# Patient Record
Sex: Male | Born: 1960 | Race: White | Hispanic: No | Marital: Married | Smoking: Current every day smoker
Health system: Southern US, Community
[De-identification: ages and names within clinical notes are randomized; demographics above are authoritative.]

## PROBLEM LIST (undated history)

## (undated) DIAGNOSIS — D805 Immunodeficiency with increased immunoglobulin M [IgM]: Secondary | ICD-10-CM

## (undated) DIAGNOSIS — C8590 Non-Hodgkin lymphoma, unspecified, unspecified site: Secondary | ICD-10-CM

## (undated) HISTORY — PX: TESTICLE TORSION REDUCTION: SHX795

## (undated) HISTORY — DX: Non-Hodgkin lymphoma, unspecified, unspecified site: C85.90

## (undated) HISTORY — PX: GASTRIC BYPASS: SHX52

## (undated) HISTORY — PX: CHOLECYSTECTOMY: SHX55

## (undated) HISTORY — PX: HERNIA REPAIR: SHX51

---

## 2008-10-22 DIAGNOSIS — C8590 Non-Hodgkin lymphoma, unspecified, unspecified site: Secondary | ICD-10-CM

## 2008-10-22 HISTORY — DX: Non-Hodgkin lymphoma, unspecified, unspecified site: C85.90

## 2010-06-23 ENCOUNTER — Ambulatory Visit (HOSPITAL_BASED_OUTPATIENT_CLINIC_OR_DEPARTMENT_OTHER): Admission: RE | Admit: 2010-06-23 | Discharge: 2010-06-23 | Payer: Self-pay | Admitting: Urology

## 2010-12-26 DIAGNOSIS — R072 Precordial pain: Secondary | ICD-10-CM

## 2010-12-27 DIAGNOSIS — R079 Chest pain, unspecified: Secondary | ICD-10-CM

## 2011-01-04 LAB — CBC
MCV: 91.3 fL (ref 78.0–100.0)
Platelets: 232 10*3/uL (ref 150–400)
RDW: 13.3 % (ref 11.5–15.5)
WBC: 16.1 10*3/uL — ABNORMAL HIGH (ref 4.0–10.5)

## 2012-03-24 ENCOUNTER — Other Ambulatory Visit (HOSPITAL_COMMUNITY): Payer: Self-pay | Admitting: Internal Medicine

## 2012-03-24 DIAGNOSIS — M545 Low back pain: Secondary | ICD-10-CM

## 2012-03-27 ENCOUNTER — Other Ambulatory Visit (HOSPITAL_COMMUNITY): Payer: BC Managed Care – PPO

## 2012-09-02 DIAGNOSIS — J329 Chronic sinusitis, unspecified: Secondary | ICD-10-CM

## 2012-09-02 DIAGNOSIS — D72820 Lymphocytosis (symptomatic): Secondary | ICD-10-CM

## 2012-09-02 DIAGNOSIS — M549 Dorsalgia, unspecified: Secondary | ICD-10-CM

## 2016-10-19 ENCOUNTER — Other Ambulatory Visit (HOSPITAL_COMMUNITY): Payer: Self-pay | Admitting: Chiropractic Medicine

## 2016-10-19 DIAGNOSIS — M9903 Segmental and somatic dysfunction of lumbar region: Secondary | ICD-10-CM

## 2016-10-24 ENCOUNTER — Ambulatory Visit (HOSPITAL_COMMUNITY): Payer: BLUE CROSS/BLUE SHIELD

## 2016-10-24 ENCOUNTER — Ambulatory Visit (HOSPITAL_COMMUNITY)
Admission: RE | Admit: 2016-10-24 | Discharge: 2016-10-24 | Disposition: A | Discharge status: Home / Self Care | Payer: BLUE CROSS/BLUE SHIELD | Source: Ambulatory Visit | Attending: Chiropractic Medicine | Admitting: Chiropractic Medicine

## 2016-10-24 ENCOUNTER — Other Ambulatory Visit (HOSPITAL_COMMUNITY): Payer: Self-pay | Admitting: Chiropractic Medicine

## 2016-10-24 ENCOUNTER — Other Ambulatory Visit (HOSPITAL_COMMUNITY): Payer: Self-pay | Admitting: Nurse Practitioner

## 2016-10-24 DIAGNOSIS — M9903 Segmental and somatic dysfunction of lumbar region: Secondary | ICD-10-CM

## 2016-10-24 DIAGNOSIS — M542 Cervicalgia: Secondary | ICD-10-CM

## 2016-10-24 DIAGNOSIS — M5124 Other intervertebral disc displacement, thoracic region: Secondary | ICD-10-CM | POA: Diagnosis not present

## 2016-10-24 DIAGNOSIS — M5136 Other intervertebral disc degeneration, lumbar region: Secondary | ICD-10-CM | POA: Diagnosis not present

## 2016-10-24 DIAGNOSIS — M5416 Radiculopathy, lumbar region: Secondary | ICD-10-CM | POA: Insufficient documentation

## 2016-10-24 DIAGNOSIS — M50221 Other cervical disc displacement at C4-C5 level: Secondary | ICD-10-CM | POA: Diagnosis not present

## 2016-12-25 ENCOUNTER — Other Ambulatory Visit: Payer: Self-pay | Admitting: Neurosurgery

## 2016-12-25 DIAGNOSIS — M5020 Other cervical disc displacement, unspecified cervical region: Secondary | ICD-10-CM

## 2016-12-25 DIAGNOSIS — M5124 Other intervertebral disc displacement, thoracic region: Secondary | ICD-10-CM

## 2017-01-03 ENCOUNTER — Ambulatory Visit
Admission: RE | Admit: 2017-01-03 | Discharge: 2017-01-03 | Disposition: A | Discharge status: Home / Self Care | Payer: BLUE CROSS/BLUE SHIELD | Source: Ambulatory Visit | Attending: Neurosurgery | Admitting: Neurosurgery

## 2017-01-03 DIAGNOSIS — M5124 Other intervertebral disc displacement, thoracic region: Secondary | ICD-10-CM

## 2017-01-03 MED ORDER — IOPAMIDOL (ISOVUE-M 300) INJECTION 61%
1.0000 mL | Freq: Once | INTRAMUSCULAR | Status: AC | PRN
  Administered 2017-01-03: 1 mL via EPIDURAL

## 2017-01-03 MED ORDER — TRIAMCINOLONE ACETONIDE 40 MG/ML IJ SUSP (RADIOLOGY)
60.0000 mg | Freq: Once | INTRAMUSCULAR | Status: AC
  Administered 2017-01-03: 60 mg via EPIDURAL

## 2017-01-03 NOTE — Discharge Instructions (Signed)

## 2017-01-16 ENCOUNTER — Other Ambulatory Visit: Payer: Self-pay | Admitting: Nurse Practitioner

## 2017-01-16 DIAGNOSIS — M5124 Other intervertebral disc displacement, thoracic region: Secondary | ICD-10-CM

## 2017-01-18 ENCOUNTER — Ambulatory Visit
Admission: RE | Admit: 2017-01-18 | Discharge: 2017-01-18 | Disposition: A | Discharge status: Home / Self Care | Payer: BLUE CROSS/BLUE SHIELD | Source: Ambulatory Visit | Attending: Nurse Practitioner | Admitting: Nurse Practitioner

## 2017-01-18 DIAGNOSIS — M5124 Other intervertebral disc displacement, thoracic region: Secondary | ICD-10-CM

## 2017-01-18 MED ORDER — TRIAMCINOLONE ACETONIDE 40 MG/ML IJ SUSP (RADIOLOGY)
60.0000 mg | Freq: Once | INTRAMUSCULAR | Status: AC
  Administered 2017-01-18: 60 mg via EPIDURAL

## 2017-01-18 MED ORDER — IOPAMIDOL (ISOVUE-M 300) INJECTION 61%
1.0000 mL | Freq: Once | INTRAMUSCULAR | Status: AC | PRN
  Administered 2017-01-18: 1 mL via EPIDURAL

## 2017-10-10 ENCOUNTER — Other Ambulatory Visit: Payer: Self-pay

## 2017-10-10 ENCOUNTER — Other Ambulatory Visit (HOSPITAL_COMMUNITY): Payer: BLUE CROSS/BLUE SHIELD

## 2017-10-10 ENCOUNTER — Encounter (HOSPITAL_COMMUNITY): Payer: BLUE CROSS/BLUE SHIELD | Attending: Oncology | Admitting: Oncology

## 2017-10-10 ENCOUNTER — Encounter (HOSPITAL_COMMUNITY): Payer: Self-pay | Admitting: Oncology

## 2017-10-10 VITALS — BP 148/91 | HR 72 | Temp 98.3°F | Resp 20 | Wt 172.0 lb

## 2017-10-10 DIAGNOSIS — D72829 Elevated white blood cell count, unspecified: Secondary | ICD-10-CM

## 2017-10-10 DIAGNOSIS — C9110 Chronic lymphocytic leukemia of B-cell type not having achieved remission: Secondary | ICD-10-CM | POA: Diagnosis not present

## 2017-10-10 NOTE — Progress Notes (Signed)
[facility] Initial Visit:  Patient Care Team: Medicine, [facility] Internal as PCP - General (Internal Medicine)  CHIEF COMPLAINTS/PURPOSE OF CONSULTATION:  Leukocytosis with lymphocyte predominance.  HISTORY OF PRESENTING ILLNESS: Bradley Howard 56 y.o. 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 He states his WBC has normally been in the 13k range however recently in December it had increased to 18k and the patient was concerned. Patient's most recent CBC on 09/26/2017 demonstrated WBC 18.3 K, hemoglobin 14 g/dL, hematocrit 42.1%, MCV 91, platelet counts 244K, differential demonstrated an absolute lymphocyte count of 12.1 K otherwise differential was normal.  Patient's previous CBC from 11/15/2016 demonstrated WBC 13.6 K, hemoglobin 13.2 g/dL, hematocrit 30.4%, platelet count 226K, absolute lymphocytes 8.7K, monocytes 1.6K otherwise differential was normal.  CBC from 12/28/2015 demonstrated WBC 13.4 K, hemoglobin 14.1 K, hematocrit 42%, platelet count 223K, absolute lymphocytes 8.2K, absolute monocytes 1.2K.  Patient had a CT abdomen pelvis on 11/20/2016 which demonstrated no evidence of hepatosplenomegaly or lymphadenopathy.  Patient denies any B symptoms including fevers chills, night sweats, unexplained weight loss.  He denies any chest pain, shortness breath, abdominal pain, nausea, vomiting, diarrhea, focal weakness.  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  He states that he has been having sinus issues for the past few weeks.  Review of Systems - Oncology ROS as per HPI otherwise 12 point ROS is negative.  MEDICAL HISTORY: Past Medical History:  Diagnosis Date  . Non Hodgkin's lymphoma ([place]) 2010    SURGICAL HISTORY: History reviewed. No pertinent surgical history.  SOCIAL HISTORY: Social History   Socioeconomic History  . Marital status: Married    Spouse name: Not on file  . Number of  children: Not on file  . Years of education: Not on file  . Highest education level: Not on file  Social Needs  . Financial resource strain: Not on file  . Food insecurity - worry: Not on file  . Food insecurity - inability: Not on file  . Transportation needs - medical: Not on file  . Transportation needs - non-medical: Not on file  Occupational History  . Not on file  Tobacco Use  . Smoking status: Not on file  Substance and Sexual Activity  . Alcohol use: Not on file  . Drug use: Not on file  . Sexual activity: Not on file  Other Topics Concern  . Not on file  Social History Narrative  . Not on file    FAMILY HISTORY History reviewed. No pertinent family history.  ALLERGIES:  has No Known Allergies.  MEDICATIONS:  No current outpatient medications on file.   No current facility-administered medications for this visit.     PHYSICAL EXAMINATION:  ECOG PERFORMANCE STATUS: 0 - Asymptomatic   Vitals:   10/10/17 1308  BP: (!) 148/91  Pulse: 72  Resp: 20  Temp: 98.3 F (36.8 C)  SpO2: 99%    Filed Weights   10/10/17 1308  Weight: 172 lb (78 kg)     Physical Exam Constitutional: Well-developed, well-nourished, and in no distress.   HENT:  Head: Normocephalic and atraumatic.  Mouth/Throat: No oropharyngeal exudate. Mucosa moist. Eyes: Pupils are equal, round, and reactive to light. Conjunctivae are normal. No scleral icterus.  Neck: Normal range of motion. Neck supple. No JVD present.  Cardiovascular: Normal rate, regular rhythm and normal heart sounds.  Exam reveals no gallop and no friction rub.  No murmur heard. Pulmonary/Chest: Effort normal and breath sounds normal. No respiratory distress. No wheezes.No rales.  Abdominal: Soft. Bowel sounds are normal. No distension. There is no tenderness. There is no guarding.  Musculoskeletal: No edema or tenderness.  Lymphadenopathy:    No cervical or supraclavicular adenopathy. No axillary  lymphadenopathy. Neurological: Alert and oriented to person, place, and time. No cranial nerve deficit.  Skin: Skin is warm and dry. No rash noted. No erythema. No pallor.  Psychiatric: Affect and judgment normal.      LABORATORY DATA: I have personally reviewed the data as listed:  No visits with results within 1 Month(s) from this visit.  Latest known visit with results is:  Hospital Outpatient Visit on 06/23/2010  Component Date Value Ref Range Status  . WBC 06/23/2010 16.1* 4.0 - 10.5 K/uL Final  . RBC 06/23/2010 4.55  4.22 - 5.81 MIL/uL Final  . Hemoglobin 06/23/2010 14.4  13.0 - 17.0 g/dL Final  . HCT 06/23/2010 41.5  39.0 - 52.0 % Final  . MCV 06/23/2010 91.3  78.0 - 100.0 fL Final  . MCH 06/23/2010 31.7  26.0 - 34.0 pg Final  . MCHC 06/23/2010 34.7  30.0 - 36.0 g/dL Final  . RDW 06/23/2010 13.3  11.5 - 15.5 % Final  . Platelets 06/23/2010 232  150 - 400 K/uL Final    RADIOGRAPHIC STUDIES: I have personally reviewed the radiological images as listed and agree with the findings in the report  No results found.  ASSESSMENT/PLAN CLL Rai Stage 0 initially diagnosed in 2011.  PLAN: -I have reviewed patient's most recent CBC in December with him in detail.   -I have reassured him that even though his leukocytosis has worsened some from 13 K to 18 K, he does not have any other indication to initiate treatment such as hemoglobin less than 10 g/dL, platelet count less than 100,000, severe B symptoms, or of bulky lymphadenopathy.   -I have reassured him that CLL is a very indolent leukemia.  We will continue observation of his blood counts.   -Return to clinic in 4 months for follow-up with repeat CBC, CMP.   All questions were answered. The patient knows to call the clinic with any problems, questions or concerns.  This note was electronically signed.    Twana First, MD  10/10/2017 1:17 PM

## 2017-11-07 ENCOUNTER — Ambulatory Visit (HOSPITAL_COMMUNITY): Payer: BLUE CROSS/BLUE SHIELD | Admitting: Internal Medicine

## 2017-11-21 ENCOUNTER — Ambulatory Visit (INDEPENDENT_AMBULATORY_CARE_PROVIDER_SITE_OTHER): Payer: BLUE CROSS/BLUE SHIELD | Admitting: Otolaryngology

## 2017-11-21 DIAGNOSIS — J343 Hypertrophy of nasal turbinates: Secondary | ICD-10-CM | POA: Diagnosis not present

## 2017-11-21 DIAGNOSIS — J342 Deviated nasal septum: Secondary | ICD-10-CM

## 2017-11-21 DIAGNOSIS — H903 Sensorineural hearing loss, bilateral: Secondary | ICD-10-CM

## 2017-11-21 DIAGNOSIS — H6983 Other specified disorders of Eustachian tube, bilateral: Secondary | ICD-10-CM

## 2018-02-10 ENCOUNTER — Ambulatory Visit (HOSPITAL_COMMUNITY): Payer: BLUE CROSS/BLUE SHIELD | Admitting: Internal Medicine

## 2018-02-10 ENCOUNTER — Other Ambulatory Visit (HOSPITAL_COMMUNITY): Payer: BLUE CROSS/BLUE SHIELD

## 2018-02-25 ENCOUNTER — Inpatient Hospital Stay (HOSPITAL_COMMUNITY): Payer: BLUE CROSS/BLUE SHIELD | Attending: Hematology

## 2018-02-25 ENCOUNTER — Other Ambulatory Visit: Payer: Self-pay

## 2018-02-25 ENCOUNTER — Inpatient Hospital Stay (HOSPITAL_BASED_OUTPATIENT_CLINIC_OR_DEPARTMENT_OTHER): Payer: BLUE CROSS/BLUE SHIELD | Admitting: Internal Medicine

## 2018-02-25 ENCOUNTER — Encounter (HOSPITAL_COMMUNITY): Payer: Self-pay | Admitting: Internal Medicine

## 2018-02-25 VITALS — BP 146/89 | HR 70 | Resp 16 | Wt 170.5 lb

## 2018-02-25 DIAGNOSIS — G8929 Other chronic pain: Secondary | ICD-10-CM | POA: Diagnosis not present

## 2018-02-25 DIAGNOSIS — Z8572 Personal history of non-Hodgkin lymphomas: Secondary | ICD-10-CM | POA: Insufficient documentation

## 2018-02-25 DIAGNOSIS — D72829 Elevated white blood cell count, unspecified: Secondary | ICD-10-CM

## 2018-02-25 DIAGNOSIS — M545 Low back pain: Secondary | ICD-10-CM | POA: Diagnosis not present

## 2018-02-25 DIAGNOSIS — C9110 Chronic lymphocytic leukemia of B-cell type not having achieved remission: Secondary | ICD-10-CM | POA: Diagnosis not present

## 2018-02-25 DIAGNOSIS — Z720 Tobacco use: Secondary | ICD-10-CM | POA: Diagnosis not present

## 2018-02-25 LAB — COMPREHENSIVE METABOLIC PANEL
ALBUMIN: 3.9 g/dL (ref 3.5–5.0)
ALK PHOS: 85 U/L (ref 38–126)
ALT: 15 U/L — AB (ref 17–63)
ANION GAP: 9 (ref 5–15)
AST: 18 U/L (ref 15–41)
BUN: 14 mg/dL (ref 6–20)
CO2: 26 mmol/L (ref 22–32)
Calcium: 9.2 mg/dL (ref 8.9–10.3)
Chloride: 102 mmol/L (ref 101–111)
Creatinine, Ser: 1.05 mg/dL (ref 0.61–1.24)
GFR calc Af Amer: >60 mL/min (ref >60)
GFR calc non Af Amer: >60 mL/min (ref >60)
GLUCOSE: 97 mg/dL (ref 65–99)
POTASSIUM: 3.9 mmol/L (ref 3.5–5.1)
SODIUM: 137 mmol/L (ref 135–145)
TOTAL PROTEIN: 7 g/dL (ref 6.5–8.1)
Total Bilirubin: 0.5 mg/dL (ref 0.3–1.2)

## 2018-02-25 LAB — CBC WITH DIFFERENTIAL/PLATELET
BASOS ABS: 0 10*3/uL (ref 0.0–0.1)
BASOS PCT: 0 %
EOS ABS: 0 10*3/uL (ref 0.0–0.7)
EOS PCT: 0 %
HCT: 41 % (ref 39.0–52.0)
HEMOGLOBIN: 13.4 g/dL (ref 13.0–17.0)
Lymphocytes Relative: 64 %
Lymphs Abs: 8.8 10*3/uL (ref 0.7–4.0)
MCH: 29.3 pg (ref 26.0–34.0)
MCHC: 32.7 g/dL (ref 30.0–36.0)
MCV: 89.7 fL (ref 78.0–100.0)
Monocytes Absolute: 1.1 10*3/uL (ref 0.1–1.0)
Monocytes Relative: 8 %
NEUTROS PCT: 28 %
Neutro Abs: 3.8 10*3/uL (ref 1.7–7.7)
Platelets: 182 10*3/uL (ref 150–400)
RBC: 4.57 MIL/uL (ref 4.22–5.81)
RDW: 13.3 % (ref 11.5–15.5)
WBC: 13.8 10*3/uL — AB (ref 4.0–10.5)

## 2018-02-25 NOTE — Progress Notes (Signed)
Diagnosis CLL (chronic lymphocytic leukemia) ([place]) - Plan: CBC with Differential/Platelet, Comprehensive metabolic panel, Lactate dehydrogenase, Beta 2 microglobulin, serum, CANCELED: CBC with Differential/Platelet, CANCELED: Comprehensive metabolic panel, CANCELED: Lactate dehydrogenase, CANCELED: Beta 2 microglobulin, serum  Staging Cancer Staging No matching staging information was found for the patient.  Assessment and Plan:  1.  CLL Rai Stage 0 initially diagnosed in 2011.  Labs done today 02/25/2018 show WBC 13.8 HB 13.4 plts 182,000.  Chemistries WNL.  CT abdomen pelvis at [facility] on 11/20/2016 which demonstrated no evidence of hepatosplenomegaly or lymphadenopathy.  Patient denies any B symptoms.  Pt shows no evidence of anemia, thrombocytopenia or adenopathy.  Flow cytometry pending.    He remains on observation and will RTC in 6 months for follow-up and repeat labs.  He is advised to notify the office if any change in symptoms prior to his next visit.    2.  Smoking.  Cessation is recommended.  He was given the option of CT of the chest for screening, but desires to contemplate having scan done.    3.  Health maintenance.  He should follow-up with PCP.  Colonoscopy screening as recommended.     Interval history:  57 y.o. male followed by Dr Talbert Cage for [place]. 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 He states his WBC has normally been in the 13k range however recently in December it had increased to 18k and the patient was concerned. Patient's most recent CBC on 09/26/2017 demonstrated WBC 18.3 K, hemoglobin 14 g/dL, hematocrit 42.1%, MCV 91, platelet counts 244K, differential demonstrated an absolute lymphocyte count of 12.1 K otherwise differential was normal.  Patient's previous CBC from 11/15/2016 demonstrated WBC 13.6 K, hemoglobin 13.2 g/dL, hematocrit 30.4%, platelet count 226K, absolute lymphocytes 8.7K, monocytes 1.6K otherwise differential was normal.  CBC  from 12/28/2015 demonstrated WBC 13.4 K, hemoglobin 14.1 K, hematocrit 42%, platelet count 223K, absolute lymphocytes 8.2K, absolute monocytes 1.2K.  Patient had a CT abdomen pelvis on 11/20/2016 which demonstrated no evidence of hepatosplenomegaly or lymphadenopathy.  Patient denies any B symptoms including fevers chills, night sweats, unexplained weight loss.  He has chronic back pain from herniated disks in his low back.  Current Status:  Pt is seen today for follow-up.  He is here to go over labs.  He denies any fevers, chills, night sweats and has noted no adenopathy.     Problem List There are no active problems to display for this patient.   Past Medical History Past Medical History:  Diagnosis Date  . Non Hodgkin's lymphoma ([place]) 2010    Past Surgical History Past Surgical History:  Procedure Laterality Date  . CHOLECYSTECTOMY    . GASTRIC BYPASS    . HERNIA REPAIR    . TESTICLE TORSION REDUCTION      Family History Family History  Problem Relation Age of Onset  . Heart attack Mother   . Heart attack Father   . Alzheimer's disease Maternal Grandmother   . Cancer Maternal Grandfather   . Cancer Paternal Grandfather      Social History  reports that he has been smoking cigarettes.  He has a 22.00 pack-year smoking history. He has never used smokeless tobacco. He reports that he does not use drugs.  Medications No current outpatient medications on file.  Allergies Patient has no known allergies.  Review of Systems Review of Systems - Oncology ROS as per HPI otherwise 12 point ROS is negative.   Physical Exam  Vitals  See nursing intake.  BP 146/89.  Pulse ox 99% on room air.   Wt Readings from Last 3 Encounters:  10/10/17 172 lb (78 kg)   Temp Readings from Last 3 Encounters:  10/10/17 98.3 F (36.8 C) (Oral)   BP Readings from Last 3 Encounters:  10/10/17 (!) 148/91  01/18/17 (!) 150/96  01/03/17 (!) 140/94   Pulse Readings from Last 3 Encounters:   10/10/17 72  01/18/17 74  01/03/17 80   Constitutional: Well-developed, well-nourished, and in no distress.   HENT: Head: Normocephalic and atraumatic.  Mouth/Throat: No oropharyngeal exudate. Mucosa moist. Eyes: Pupils are equal, round, and reactive to light. Conjunctivae are normal. No scleral icterus.  Neck: Normal range of motion. Neck supple. No JVD present.  Cardiovascular: Normal rate, regular rhythm and normal heart sounds.  Exam reveals no gallop and no friction rub.   No murmur heard. Pulmonary/Chest: Effort normal and breath sounds normal. No respiratory distress. No wheezes.No rales.  Abdominal: Soft. Bowel sounds are normal. No distension. There is no tenderness. There is no guarding.  Musculoskeletal: No edema or tenderness.  Lymphadenopathy: No cervical. Axillary or supraclavicular adenopathy.  Neurological: Alert and oriented to person, place, and time. No cranial nerve deficit.  Skin: Skin is warm and dry. No rash noted. No erythema. No pallor. Occasional bruise noted.   Psychiatric: Affect and judgment normal.   Labs Appointment on 02/25/2018  Component Date Value Ref Range Status  . WBC 02/25/2018 13.8* 4.0 - 10.5 K/uL Final  . RBC 02/25/2018 4.57  4.22 - 5.81 MIL/uL Final  . Hemoglobin 02/25/2018 13.4  13.0 - 17.0 g/dL Final  . HCT 02/25/2018 41.0  39.0 - 52.0 % Final  . MCV 02/25/2018 89.7  78.0 - 100.0 fL Final  . MCH 02/25/2018 29.3  26.0 - 34.0 pg Final  . MCHC 02/25/2018 32.7  30.0 - 36.0 g/dL Final  . RDW 02/25/2018 13.3  11.5 - 15.5 % Final  . Platelets 02/25/2018 182  150 - 400 K/uL Final  . Neutrophils Relative % 02/25/2018 28  % Final  . Neutro Abs 02/25/2018 3.8  1.7 - 7.7 K/uL Final  . Lymphocytes Relative 02/25/2018 64  % Final  . Lymphs Abs 02/25/2018 8.8  0.7 - 4.0 K/uL Final  . Monocytes Relative 02/25/2018 8  % Final  . Monocytes Absolute 02/25/2018 1.1  0.1 - 1.0 K/uL Final  . Eosinophils Relative 02/25/2018 0  % Final  . Eosinophils  Absolute 02/25/2018 0.0  0.0 - 0.7 K/uL Final  . Basophils Relative 02/25/2018 0  % Final  . Basophils Absolute 02/25/2018 0.0  0.0 - 0.1 K/uL Final   Performed at [facility], [address] [place], [place] [address]  . Sodium 02/25/2018 137  135 - 145 mmol/L Final  . Potassium 02/25/2018 3.9  3.5 - 5.1 mmol/L Final  . Chloride 02/25/2018 102  101 - 111 mmol/L Final  . CO2 02/25/2018 26  22 - 32 mmol/L Final  . Glucose, Bld 02/25/2018 97  65 - 99 mg/dL Final  . BUN 02/25/2018 14  6 - 20 mg/dL Final  . Creatinine, Ser 02/25/2018 1.05  0.61 - 1.24 mg/dL Final  . Calcium 02/25/2018 9.2  8.9 - 10.3 mg/dL Final  . Total Protein 02/25/2018 7.0  6.5 - 8.1 g/dL Final  . Albumin 02/25/2018 3.9  3.5 - 5.0 g/dL Final  . AST 02/25/2018 18  15 - 41 U/L Final  . ALT 02/25/2018 15* 17 - 63 U/L Final  .  Alkaline Phosphatase 02/25/2018 85  38 - 126 U/L Final  . Total Bilirubin 02/25/2018 0.5  0.3 - 1.2 mg/dL Final  . GFR calc non Af Amer 02/25/2018 >60  >60 mL/min Final  . GFR calc Af Amer 02/25/2018 >60  >60 mL/min Final   Comment: (NOTE) The eGFR has been calculated using the CKD EPI equation. This calculation has not been validated in all clinical situations. eGFR's persistently <60 mL/min signify possible Chronic Kidney Disease.   Georgiann Hahn gap 02/25/2018 9  5 - 15 Final   Performed at [facility], [address], [place], [place] [address]     Pathology Orders Placed This Encounter  Procedures  . CBC with Differential/Platelet    Standing Status:   Future    Standing Expiration Date:   02/26/2019  . Comprehensive metabolic panel    Standing Status:   Future    Standing Expiration Date:   02/26/2019  . Lactate dehydrogenase    Standing Status:   Future    Standing Expiration Date:   02/26/2019  . Beta 2 microglobulin, serum    Standing Status:   Future    Standing Expiration Date:   02/26/2019       Zoila Shutter MD

## 2018-09-03 ENCOUNTER — Other Ambulatory Visit (HOSPITAL_COMMUNITY): Payer: BLUE CROSS/BLUE SHIELD

## 2018-09-03 ENCOUNTER — Ambulatory Visit (HOSPITAL_COMMUNITY): Payer: BLUE CROSS/BLUE SHIELD | Admitting: Internal Medicine

## 2018-10-23 ENCOUNTER — Other Ambulatory Visit: Payer: Self-pay | Admitting: Nurse Practitioner

## 2018-10-23 DIAGNOSIS — M5124 Other intervertebral disc displacement, thoracic region: Secondary | ICD-10-CM

## 2018-11-05 ENCOUNTER — Ambulatory Visit
Admission: RE | Admit: 2018-11-05 | Discharge: 2018-11-05 | Disposition: A | Discharge status: Home / Self Care | Payer: BLUE CROSS/BLUE SHIELD | Source: Ambulatory Visit | Attending: Nurse Practitioner | Admitting: Nurse Practitioner

## 2018-11-05 DIAGNOSIS — M5124 Other intervertebral disc displacement, thoracic region: Secondary | ICD-10-CM

## 2018-11-05 MED ORDER — TRIAMCINOLONE ACETONIDE 40 MG/ML IJ SUSP (RADIOLOGY)
60.0000 mg | Freq: Once | INTRAMUSCULAR | Status: AC
  Administered 2018-11-05: 60 mg via EPIDURAL

## 2018-11-05 MED ORDER — IOPAMIDOL (ISOVUE-M 300) INJECTION 61%
1.0000 mL | Freq: Once | INTRAMUSCULAR | Status: AC | PRN
  Administered 2018-11-05: 1 mL via EPIDURAL

## 2018-11-05 NOTE — Discharge Instructions (Signed)

## 2018-11-17 ENCOUNTER — Other Ambulatory Visit: Payer: Self-pay | Admitting: Nurse Practitioner

## 2018-11-17 DIAGNOSIS — M5124 Other intervertebral disc displacement, thoracic region: Secondary | ICD-10-CM

## 2018-11-19 ENCOUNTER — Other Ambulatory Visit (HOSPITAL_COMMUNITY): Payer: BLUE CROSS/BLUE SHIELD

## 2018-11-26 ENCOUNTER — Ambulatory Visit (HOSPITAL_COMMUNITY): Payer: BLUE CROSS/BLUE SHIELD | Admitting: Internal Medicine

## 2018-12-05 ENCOUNTER — Ambulatory Visit
Admission: RE | Admit: 2018-12-05 | Discharge: 2018-12-05 | Disposition: A | Discharge status: Home / Self Care | Payer: BLUE CROSS/BLUE SHIELD | Source: Ambulatory Visit | Attending: Nurse Practitioner | Admitting: Nurse Practitioner

## 2018-12-05 DIAGNOSIS — M5124 Other intervertebral disc displacement, thoracic region: Secondary | ICD-10-CM

## 2018-12-05 MED ORDER — TRIAMCINOLONE ACETONIDE 40 MG/ML IJ SUSP (RADIOLOGY)
60.0000 mg | Freq: Once | INTRAMUSCULAR | Status: AC
  Administered 2018-12-05: 60 mg via EPIDURAL

## 2018-12-05 MED ORDER — IOHEXOL 300 MG/ML  SOLN
1.0000 mL | Freq: Once | INTRAMUSCULAR | Status: AC | PRN
  Administered 2018-12-05: 1 mL via EPIDURAL

## 2018-12-05 NOTE — Discharge Instructions (Signed)

## 2019-04-10 ENCOUNTER — Other Ambulatory Visit: Payer: Self-pay | Admitting: Nurse Practitioner

## 2019-04-10 DIAGNOSIS — M5136 Other intervertebral disc degeneration, lumbar region: Secondary | ICD-10-CM

## 2019-04-10 DIAGNOSIS — M5126 Other intervertebral disc displacement, lumbar region: Secondary | ICD-10-CM

## 2019-04-27 ENCOUNTER — Ambulatory Visit
Admission: RE | Admit: 2019-04-27 | Discharge: 2019-04-27 | Disposition: A | Discharge status: Home / Self Care | Payer: BC Managed Care – PPO | Source: Ambulatory Visit | Attending: Nurse Practitioner | Admitting: Nurse Practitioner

## 2019-04-27 ENCOUNTER — Other Ambulatory Visit: Payer: Self-pay

## 2019-04-27 DIAGNOSIS — M5126 Other intervertebral disc displacement, lumbar region: Secondary | ICD-10-CM

## 2019-04-27 DIAGNOSIS — M5136 Other intervertebral disc degeneration, lumbar region: Secondary | ICD-10-CM

## 2019-04-27 MED ORDER — METHYLPREDNISOLONE ACETATE 40 MG/ML INJ SUSP (RADIOLOG
120.0000 mg | Freq: Once | INTRAMUSCULAR | Status: AC
  Administered 2019-04-27: 09:00:00 120 mg via EPIDURAL

## 2019-04-27 MED ORDER — IOPAMIDOL (ISOVUE-M 200) INJECTION 41%
1.0000 mL | Freq: Once | INTRAMUSCULAR | Status: AC
  Administered 2019-04-27: 09:00:00 1 mL via EPIDURAL

## 2019-04-27 NOTE — Discharge Instructions (Signed)

## 2019-06-09 ENCOUNTER — Other Ambulatory Visit: Payer: Self-pay | Admitting: Nurse Practitioner

## 2019-06-09 DIAGNOSIS — M545 Low back pain, unspecified: Secondary | ICD-10-CM

## 2019-06-09 DIAGNOSIS — G8929 Other chronic pain: Secondary | ICD-10-CM

## 2019-06-11 ENCOUNTER — Ambulatory Visit
Admission: RE | Admit: 2019-06-11 | Discharge: 2019-06-11 | Disposition: A | Discharge status: Home / Self Care | Payer: BC Managed Care – PPO | Source: Ambulatory Visit | Attending: Nurse Practitioner | Admitting: Nurse Practitioner

## 2019-06-11 DIAGNOSIS — G8929 Other chronic pain: Secondary | ICD-10-CM

## 2019-06-11 MED ORDER — METHYLPREDNISOLONE ACETATE 40 MG/ML INJ SUSP (RADIOLOG: 120.0000 mg | Freq: Once | INTRAMUSCULAR | Status: DC

## 2019-06-11 MED ORDER — IOPAMIDOL (ISOVUE-M 200) INJECTION 41%: 1.0000 mL | Freq: Once | INTRAMUSCULAR | Status: DC

## 2019-06-11 NOTE — Discharge Instructions (Signed)

## 2019-08-26 ENCOUNTER — Other Ambulatory Visit: Payer: Self-pay | Admitting: Nurse Practitioner

## 2019-08-26 DIAGNOSIS — G8929 Other chronic pain: Secondary | ICD-10-CM

## 2019-08-31 ENCOUNTER — Ambulatory Visit
Admission: RE | Admit: 2019-08-31 | Discharge: 2019-08-31 | Disposition: A | Discharge status: Home / Self Care | Payer: BC Managed Care – PPO | Source: Ambulatory Visit | Attending: Nurse Practitioner | Admitting: Nurse Practitioner

## 2019-08-31 ENCOUNTER — Other Ambulatory Visit: Payer: Self-pay

## 2019-08-31 DIAGNOSIS — M545 Low back pain, unspecified: Secondary | ICD-10-CM

## 2019-08-31 DIAGNOSIS — G8929 Other chronic pain: Secondary | ICD-10-CM

## 2019-08-31 MED ORDER — METHYLPREDNISOLONE ACETATE 40 MG/ML INJ SUSP (RADIOLOG
120.0000 mg | Freq: Once | INTRAMUSCULAR | Status: AC
  Administered 2019-08-31: 12:00:00 120 mg via EPIDURAL

## 2019-08-31 MED ORDER — IOPAMIDOL (ISOVUE-M 200) INJECTION 41%
1.0000 mL | Freq: Once | INTRAMUSCULAR | Status: AC
  Administered 2019-08-31: 1 mL via EPIDURAL

## 2019-08-31 NOTE — Discharge Instructions (Signed)

## 2019-12-15 ENCOUNTER — Other Ambulatory Visit: Payer: Self-pay | Admitting: Nurse Practitioner

## 2019-12-15 DIAGNOSIS — M545 Low back pain, unspecified: Secondary | ICD-10-CM

## 2019-12-15 DIAGNOSIS — G8929 Other chronic pain: Secondary | ICD-10-CM

## 2019-12-18 ENCOUNTER — Other Ambulatory Visit: Payer: BC Managed Care – PPO

## 2019-12-23 ENCOUNTER — Inpatient Hospital Stay: Admission: RE | Admit: 2019-12-23 | Payer: BC Managed Care – PPO | Source: Ambulatory Visit

## 2019-12-24 ENCOUNTER — Inpatient Hospital Stay: Admission: RE | Admit: 2019-12-24 | Payer: BC Managed Care – PPO | Source: Ambulatory Visit

## 2019-12-31 ENCOUNTER — Other Ambulatory Visit: Payer: Self-pay | Admitting: Nurse Practitioner

## 2019-12-31 DIAGNOSIS — M545 Low back pain, unspecified: Secondary | ICD-10-CM

## 2020-01-04 ENCOUNTER — Ambulatory Visit
Admission: RE | Admit: 2020-01-04 | Discharge: 2020-01-04 | Disposition: A | Discharge status: Home / Self Care | Payer: BC Managed Care – PPO | Source: Ambulatory Visit | Attending: Nurse Practitioner | Admitting: Nurse Practitioner

## 2020-01-04 DIAGNOSIS — M545 Low back pain, unspecified: Secondary | ICD-10-CM

## 2020-01-04 MED ORDER — GADOBENATE DIMEGLUMINE 529 MG/ML IV SOLN
15.0000 mL | Freq: Once | INTRAVENOUS | Status: AC | PRN
  Administered 2020-01-04: 15 mL via INTRAVENOUS

## 2020-02-23 ENCOUNTER — Encounter (INDEPENDENT_AMBULATORY_CARE_PROVIDER_SITE_OTHER): Payer: Self-pay | Admitting: Gastroenterology

## 2020-03-24 ENCOUNTER — Encounter (INDEPENDENT_AMBULATORY_CARE_PROVIDER_SITE_OTHER): Payer: Self-pay

## 2020-05-23 ENCOUNTER — Other Ambulatory Visit: Payer: Self-pay

## 2020-05-23 ENCOUNTER — Encounter (INDEPENDENT_AMBULATORY_CARE_PROVIDER_SITE_OTHER): Payer: Self-pay | Admitting: Gastroenterology

## 2020-05-23 ENCOUNTER — Encounter (INDEPENDENT_AMBULATORY_CARE_PROVIDER_SITE_OTHER): Payer: Self-pay | Admitting: *Deleted

## 2020-05-23 ENCOUNTER — Other Ambulatory Visit (INDEPENDENT_AMBULATORY_CARE_PROVIDER_SITE_OTHER): Payer: Self-pay | Admitting: *Deleted

## 2020-05-23 ENCOUNTER — Telehealth (INDEPENDENT_AMBULATORY_CARE_PROVIDER_SITE_OTHER): Payer: Self-pay | Admitting: *Deleted

## 2020-05-23 ENCOUNTER — Ambulatory Visit (INDEPENDENT_AMBULATORY_CARE_PROVIDER_SITE_OTHER): Payer: BC Managed Care – PPO | Admitting: Gastroenterology

## 2020-05-23 VITALS — BP 173/79 | HR 77 | Resp 16 | Ht 72.0 in | Wt 171.0 lb

## 2020-05-23 DIAGNOSIS — K269 Duodenal ulcer, unspecified as acute or chronic, without hemorrhage or perforation: Secondary | ICD-10-CM | POA: Diagnosis not present

## 2020-05-23 DIAGNOSIS — Z86010 Personal history of colonic polyps: Secondary | ICD-10-CM | POA: Diagnosis not present

## 2020-05-23 MED ORDER — SUTAB 1479-225-188 MG PO TABS: 1.0000 | ORAL_TABLET | Freq: Once | ORAL | 0 refills | Status: AC

## 2020-05-23 NOTE — Progress Notes (Signed)
Patient profile: Bradley Howard is a 59 y.o. male seen for evaluation of anemia.   History of Present Illness: Bradley Howard is seen today for evaluation. He reports overall feeling well w/o any specific GI symptoms. No n/v, dysphagia. No GERD sx on low dose omeprazole. Denies epigastric pain.   Reports BM can vary - having BM daily usually, occasionally diarrhea w/ loose stools up to 4x/day but denies severe constipation/diarrhea. Bowel consistency mainly depends on diet. No abd pain. No blood in stool or dark stools.   Previously used ibuprofen daily (described as "a lot all last year"). Now has significantly decreased and is using excerdin migraine 1-2x/week. Smokes 3/4 PPD. Alcohol - usually one drink per night weekdays, 3-4 drinks on weekends.   Report family history of colon cancer in paternal and maternal grandfathers. Both parents are deceased from heart issues. Sisters are Audiological scientist. 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   Wt Readings from Last 3 Encounters:  05/23/20 171 lb (77.6 kg)  02/25/18 170 lb 8 oz (77.3 kg)  10/10/17 172 lb (78 kg)    Records to be scanned to chart-done for IDA. Per chart notes Hgb 12.6, will request labs.  Last Colonoscopy: 01/2020-55mm polyp descending x 2, 41mm polyp descending, grand 1 hemorrhoids. Unable to advance beyond transverse 2/2 tight turn at splenic flexure    DESCENDING COLON POLYP TUBULAR ADENOMA, ONE. NO HIGH GRADE DYSPLASIA OR MALIGNANCY.   Last Endoscopy: 01/2020--two non bleeding shallow ulcers in duodenum. Gastritis. Prior nissen intact.      Past Medical History:  Past Medical History:  Diagnosis Date  . Non Hodgkin's lymphoma ([place]) 2010    Problem List: There are no problems to display for this patient.   Past Surgical History: Past Surgical History:  Procedure Laterality Date  . CHOLECYSTECTOMY    . GASTRIC BYPASS    . HERNIA REPAIR    . TESTICLE TORSION REDUCTION        Allergies: No Known Allergies    Home Medications:  Current Outpatient Medications:  .  acetaminophen (TYLENOL) 500 MG tablet, Take 500 mg by mouth every 6 (six) hours as needed., Disp: , Rfl:  .  cyanocobalamin (,VITAMIN B-12,) 1000 MCG/ML injection, Inject into the muscle., Disp: , Rfl:  .  omeprazole (PRILOSEC) 20 MG capsule, Take 20 mg by mouth daily., Disp: , Rfl:    Family History: family history includes Alzheimer's disease in his maternal grandmother; Cancer in his maternal grandfather and paternal grandfather; Heart attack in his father and mother.    Social History:   reports that he has been smoking cigarettes. He has a 22.00 pack-year smoking history. He has never used smokeless tobacco. He reports current alcohol use of about 6.0 standard drinks of alcohol per week. He reports that he does not use drugs.   Review of Systems: Constitutional: Denies weight loss/weight gain  Eyes: No changes in vision. ENT: No oral lesions, sore throat.  GI: see HPI.  Heme/Lymph: No easy bruising.  CV: No chest pain.  GU: No hematuria.  Integumentary: No rashes.  Neuro: No headaches.  Psych: No depression/anxiety.  Endocrine: No heat/cold intolerance.  Allergic/Immunologic: No urticaria.  Resp: No cough, SOB.  Musculoskeletal: No joint swelling.    Physical Examination: BP (!) 173/79   Pulse 77   Resp 16   Ht 6' (1.829 m)   Wt 171 lb (77.6 kg)   BMI 23.19 kg/m  Per pt hx of white coat HTN. Has BP cuff to repeat at home.  Gen: NAD, alert and oriented x 4 HEENT: PEERLA, EOMI, Neck: supple, no JVD Chest: CTA bilaterally, no wheezes, crackles, or other adventitious sounds CV: RRR, no m/g/c/r Abd: soft, NT, ND, +BS in all four quadrants; no HSM, guarding, ridigity, or rebound tenderness Ext: no edema, well perfused with 2+ pulses, Skin: no rash or lesions noted on observed skin Lymph: no noted LAD  Data:   Care everywhere labs -    02/2020- WBC 15, Hgb 13.7, MCV  91. Atypical lymphocytes and smudge cells present.      Assessment/Plan: Mr. Harner is a 59 y.o. male  Breven was seen today for new patient (initial visit).  Diagnoses and all orders for this visit:  History of colon polyps  Duodenal ulcer    1. Hx of colon polyps -  3 small polyps on colonoscopy as above only completed to splenic flexure, one TA. This was his first colonoscopy. Needs full evaluation of colon - will schedule repeat colonoscopy. Notes report IDA but most recent Hgb normal and denies prior being on iron supplement. Ferritin is still low at 21.9 on hematology labs 02/2020. He is no longer using nsaids frequently. Denies prior issues w/ sedation.   2. Duodenal ulcer - on above EGD. No longer using nsaids as heavy. Also discussed decreasing tobacco/alcohol. On low dose PPI 20mg  once day.    Patient denies CP, SOB, and use of blood thinners. I discussed the risks and benefits of procedure including bleeding, perforation, infection, missed lesions, medication reactions and possible hospitalization or surgery if complications. All questions answered.    I personally performed the service, non-incident to. (WP)  Laurine Blazer, [facility]

## 2020-05-23 NOTE — Patient Instructions (Signed)
We are scheduling colonoscopy for evaluation

## 2020-05-23 NOTE — Telephone Encounter (Signed)
Patient needs [employer] (copay card) ° °

## 2020-06-30 NOTE — Patient Instructions (Signed)
DAMARIA STOFKO III  06/30/2020     @PREFPERIOPPHARMACY @   Your procedure is scheduled on  07/06/2020.  Report to [facility] at  1200  P.M.  Call this number if you have problems the morning of surgery:  [phone]   Remember:  Follow the diet and prep instructions given to you by the office.                     Take these medicines the morning of surgery with A SIP OF WATER omeprazole.    Do not wear jewelry, make-up or nail polish.  Do not wear lotions, powders, or perfumes. Please wear deodorant and brush your teeth.  Do not shave 48 hours prior to surgery.  Men may shave face and neck.  Do not bring valuables to the hospital.  [facility] is not responsible for any belongings or valuables.  Contacts, dentures or bridgework may not be worn into surgery.  Leave your suitcase in the car.  After surgery it may be brought to your room.  For patients admitted to the hospital, discharge time will be determined by your treatment team.  Patients discharged the day of surgery will not be allowed to drive home.   Name and phone number of your driver:   family Special instructions:  DO NOT smoke the morning of your procedure.  Please read over the following fact sheets that you were given. Anesthesia Post-op Instructions and Care and Recovery After Surgery       Colonoscopy, Adult, Care After This sheet gives you information about how to care for yourself after your procedure. Your health care provider may also give you more specific instructions. If you have problems or questions, contact your health care provider. What can I expect after the procedure? After the procedure, it is common to have:  A small amount of blood in your stool for 24 hours after the procedure.  Some gas.  Mild cramping or bloating of your abdomen. Follow these instructions at home: Eating and drinking   Drink enough fluid to keep your urine pale yellow.  Follow instructions from your  health care provider about eating or drinking restrictions.  Resume your normal diet as instructed by your health care provider. Avoid heavy or fried foods that are hard to digest. Activity  Rest as told by your health care provider.  Avoid sitting for a long time without moving. Get up to take short walks every 1-2 hours. This is important to improve blood flow and breathing. Ask for help if you feel weak or unsteady.  Return to your normal activities as told by your health care provider. Ask your health care provider what activities are safe for you. Managing cramping and bloating   Try walking around when you have cramps or feel bloated.  Apply heat to your abdomen as told by your health care provider. Use the heat source that your health care provider recommends, such as a moist heat pack or a heating pad. ? Place a towel between your skin and the heat source. ? Leave the heat on for 20-30 minutes. ? Remove the heat if your skin turns bright red. This is especially important if you are unable to feel pain, heat, or cold. You may have a greater risk of getting burned. General instructions  For the first 24 hours after the procedure: ? Do not drive or use machinery. ? Do not sign important documents. ?  Do not drink alcohol. ? Do your regular daily activities at a slower pace than normal. ? Eat soft foods that are easy to digest.  Take over-the-counter and prescription medicines only as told by your health care provider.  Keep all follow-up visits as told by your health care provider. This is important. Contact a health care provider if:  You have blood in your stool 2-3 days after the procedure. Get help right away if you have:  More than a small spotting of blood in your stool.  Large blood clots in your stool.  Swelling of your abdomen.  Nausea or vomiting.  A fever.  Increasing pain in your abdomen that is not relieved with medicine. Summary  After the procedure,  it is common to have a small amount of blood in your stool. You may also have mild cramping and bloating of your abdomen.  For the first 24 hours after the procedure, do not drive or use machinery, sign important documents, or drink alcohol.  Get help right away if you have a lot of blood in your stool, nausea or vomiting, a fever, or increased pain in your abdomen. This information is not intended to replace advice given to you by your health care provider. Make sure you discuss any questions you have with your health care provider. Document Revised: 05/04/2019 Document Reviewed: 05/04/2019 Elsevier Patient Education  [place] After These instructions provide you with information about caring for yourself after your procedure. Your health care provider may also give you more specific instructions. Your treatment has been planned according to current medical practices, but problems sometimes occur. Call your health care provider if you have any problems or questions after your procedure. What can I expect after the procedure? After your procedure, you may:  Feel sleepy for several hours.  Feel clumsy and have poor balance for several hours.  Feel forgetful about what happened after the procedure.  Have poor judgment for several hours.  Feel nauseous or vomit.  Have a sore throat if you had a breathing tube during the procedure. Follow these instructions at home: For at least 24 hours after the procedure:      Have a responsible adult stay with you. It is important to have someone help care for you until you are awake and alert.  Rest as needed.  Do not: ? Participate in activities in which you could fall or become injured. ? Drive. ? Use heavy machinery. ? Drink alcohol. ? Take sleeping pills or medicines that cause drowsiness. ? Make important decisions or sign legal documents. ? Take care of children on your own. Eating and  drinking  Follow the diet that is recommended by your health care provider.  If you vomit, drink water, juice, or soup when you can drink without vomiting.  Make sure you have little or no nausea before eating solid foods. General instructions  Take over-the-counter and prescription medicines only as told by your health care provider.  If you have sleep apnea, surgery and certain medicines can increase your risk for breathing problems. Follow instructions from your health care provider about wearing your sleep device: ? Anytime you are sleeping, including during daytime naps. ? While taking prescription pain medicines, sleeping medicines, or medicines that make you drowsy.  If you smoke, do not smoke without supervision.  Keep all follow-up visits as told by your health care provider. This is important. Contact a health care provider if:  You keep  feeling nauseous or you keep vomiting.  You feel light-headed.  You develop a rash.  You have a fever. Get help right away if:  You have trouble breathing. Summary  For several hours after your procedure, you may feel sleepy and have poor judgment.  Have a responsible adult stay with you for at least 24 hours or until you are awake and alert. This information is not intended to replace advice given to you by your health care provider. Make sure you discuss any questions you have with your health care provider. Document Revised: 01/06/2018 Document Reviewed: 01/29/2016 Elsevier Patient Education  [place].

## 2020-07-04 ENCOUNTER — Encounter (HOSPITAL_COMMUNITY): Payer: Self-pay

## 2020-07-04 ENCOUNTER — Encounter (HOSPITAL_COMMUNITY)
Admission: RE | Admit: 2020-07-04 | Discharge: 2020-07-04 | Disposition: A | Discharge status: Home / Self Care | Payer: BC Managed Care – PPO | Source: Ambulatory Visit | Attending: Internal Medicine | Admitting: Internal Medicine

## 2020-07-04 ENCOUNTER — Other Ambulatory Visit: Payer: Self-pay

## 2020-07-04 ENCOUNTER — Other Ambulatory Visit (HOSPITAL_COMMUNITY)
Admission: RE | Admit: 2020-07-04 | Discharge: 2020-07-04 | Disposition: A | Discharge status: Home / Self Care | Payer: BC Managed Care – PPO | Source: Ambulatory Visit | Attending: Internal Medicine | Admitting: Internal Medicine

## 2020-07-04 DIAGNOSIS — Z20822 Contact with and (suspected) exposure to covid-19: Secondary | ICD-10-CM | POA: Diagnosis not present

## 2020-07-04 DIAGNOSIS — Z01812 Encounter for preprocedural laboratory examination: Secondary | ICD-10-CM | POA: Insufficient documentation

## 2020-07-04 HISTORY — DX: Immunodeficiency with increased immunoglobulin m (igm): D80.5

## 2020-07-04 LAB — CBC WITH DIFFERENTIAL/PLATELET
Abs Immature Granulocytes: 0.01 10*3/uL (ref 0.00–0.07)
Basophils Absolute: 0.1 10*3/uL (ref 0.0–0.1)
Basophils Relative: 0 %
Eosinophils Absolute: 0.1 10*3/uL (ref 0.0–0.5)
Eosinophils Relative: 1 %
HCT: 40.2 % (ref 39.0–52.0)
Hemoglobin: 13.1 g/dL (ref 13.0–17.0)
Immature Granulocytes: 0 %
Lymphocytes Relative: 51 %
Lymphs Abs: 6.7 10*3/uL — ABNORMAL HIGH (ref 0.7–4.0)
MCH: 30 pg (ref 26.0–34.0)
MCHC: 32.6 g/dL (ref 30.0–36.0)
MCV: 92.2 fL (ref 80.0–100.0)
Monocytes Absolute: 2 10*3/uL — ABNORMAL HIGH (ref 0.1–1.0)
Monocytes Relative: 15 %
Neutro Abs: 4.3 10*3/uL (ref 1.7–7.7)
Neutrophils Relative %: 33 %
Platelets: 208 10*3/uL (ref 150–400)
RBC: 4.36 MIL/uL (ref 4.22–5.81)
RDW: 13.6 % (ref 11.5–15.5)
WBC: 13.2 10*3/uL — ABNORMAL HIGH (ref 4.0–10.5)
nRBC: 0 % (ref 0.0–0.2)

## 2020-07-05 LAB — SARS CORONAVIRUS 2 (TAT 6-24 HRS): SARS Coronavirus 2: NEGATIVE

## 2020-07-06 ENCOUNTER — Encounter (HOSPITAL_COMMUNITY): Payer: Self-pay | Admitting: Internal Medicine

## 2020-07-06 ENCOUNTER — Encounter (HOSPITAL_COMMUNITY)
Admission: RE | Disposition: A | Discharge status: Home / Self Care | Payer: Self-pay | Source: Home / Self Care | Attending: Internal Medicine

## 2020-07-06 ENCOUNTER — Ambulatory Visit (HOSPITAL_COMMUNITY): Payer: BC Managed Care – PPO | Admitting: Anesthesiology

## 2020-07-06 ENCOUNTER — Ambulatory Visit (HOSPITAL_COMMUNITY)
Admission: RE | Admit: 2020-07-06 | Discharge: 2020-07-06 | Disposition: A | Discharge status: Home / Self Care | Payer: BC Managed Care – PPO | Attending: Internal Medicine | Admitting: Internal Medicine

## 2020-07-06 ENCOUNTER — Other Ambulatory Visit: Payer: Self-pay

## 2020-07-06 DIAGNOSIS — K5730 Diverticulosis of large intestine without perforation or abscess without bleeding: Secondary | ICD-10-CM | POA: Diagnosis not present

## 2020-07-06 DIAGNOSIS — Z86010 Personal history of colonic polyps: Secondary | ICD-10-CM

## 2020-07-06 DIAGNOSIS — Z800 Family history of malignant neoplasm of digestive organs: Secondary | ICD-10-CM | POA: Diagnosis not present

## 2020-07-06 DIAGNOSIS — Z79899 Other long term (current) drug therapy: Secondary | ICD-10-CM | POA: Insufficient documentation

## 2020-07-06 DIAGNOSIS — Z9884 Bariatric surgery status: Secondary | ICD-10-CM | POA: Diagnosis not present

## 2020-07-06 DIAGNOSIS — D122 Benign neoplasm of ascending colon: Secondary | ICD-10-CM

## 2020-07-06 DIAGNOSIS — Z0489 Encounter for examination and observation for other specified reasons: Secondary | ICD-10-CM | POA: Diagnosis present

## 2020-07-06 DIAGNOSIS — D125 Benign neoplasm of sigmoid colon: Secondary | ICD-10-CM | POA: Insufficient documentation

## 2020-07-06 DIAGNOSIS — Z1211 Encounter for screening for malignant neoplasm of colon: Secondary | ICD-10-CM

## 2020-07-06 DIAGNOSIS — F17210 Nicotine dependence, cigarettes, uncomplicated: Secondary | ICD-10-CM | POA: Insufficient documentation

## 2020-07-06 DIAGNOSIS — Z8572 Personal history of non-Hodgkin lymphomas: Secondary | ICD-10-CM | POA: Diagnosis not present

## 2020-07-06 DIAGNOSIS — Z888 Allergy status to other drugs, medicaments and biological substances status: Secondary | ICD-10-CM | POA: Diagnosis not present

## 2020-07-06 DIAGNOSIS — D123 Benign neoplasm of transverse colon: Secondary | ICD-10-CM | POA: Insufficient documentation

## 2020-07-06 HISTORY — PX: POLYPECTOMY: SHX5525

## 2020-07-06 HISTORY — PX: COLONOSCOPY WITH PROPOFOL: SHX5780

## 2020-07-06 LAB — HM COLONOSCOPY

## 2020-07-06 SURGERY — COLONOSCOPY WITH PROPOFOL
Anesthesia: General

## 2020-07-06 MED ORDER — PROPOFOL 10 MG/ML IV BOLUS
INTRAVENOUS | Status: DC | PRN
  Administered 2020-07-06: 20 mg via INTRAVENOUS

## 2020-07-06 MED ORDER — KETAMINE HCL 50 MG/5ML IJ SOSY
PREFILLED_SYRINGE | INTRAMUSCULAR | Status: AC
  Filled 2020-07-06: qty 5

## 2020-07-06 MED ORDER — KETAMINE HCL 10 MG/ML IJ SOLN
INTRAMUSCULAR | Status: DC | PRN
  Administered 2020-07-06: 30 mg via INTRAVENOUS

## 2020-07-06 MED ORDER — PROPOFOL 500 MG/50ML IV EMUL
INTRAVENOUS | Status: DC | PRN
  Administered 2020-07-06: 200 ug/kg/min via INTRAVENOUS

## 2020-07-06 MED ORDER — LACTATED RINGERS IV SOLN: INTRAVENOUS | Status: DC | PRN

## 2020-07-06 MED ORDER — LACTATED RINGERS IV SOLN: Freq: Once | INTRAVENOUS | Status: AC

## 2020-07-06 MED ORDER — STERILE WATER FOR IRRIGATION IR SOLN
Status: DC | PRN
  Administered 2020-07-06: 100 mL

## 2020-07-06 MED ORDER — LIDOCAINE HCL (CARDIAC) PF 100 MG/5ML IV SOSY
PREFILLED_SYRINGE | INTRAVENOUS | Status: DC | PRN
  Administered 2020-07-06: 60 mg via INTRATRACHEAL

## 2020-07-06 MED ORDER — KETOROLAC TROMETHAMINE 30 MG/ML IJ SOLN
30.0000 mg | Freq: Once | INTRAMUSCULAR | Status: AC
  Administered 2020-07-06: 30 mg via INTRAVENOUS

## 2020-07-06 MED ORDER — KETOROLAC TROMETHAMINE 30 MG/ML IJ SOLN
INTRAMUSCULAR | Status: AC
  Filled 2020-07-06: qty 1

## 2020-07-06 MED ORDER — PROPOFOL 10 MG/ML IV BOLUS
INTRAVENOUS | Status: AC
  Filled 2020-07-06: qty 20

## 2020-07-06 MED ORDER — GLYCOPYRROLATE 0.2 MG/ML IJ SOLN
INTRAMUSCULAR | Status: DC | PRN
  Administered 2020-07-06: .1 mg via INTRAVENOUS

## 2020-07-06 MED ORDER — LIDOCAINE 2% (20 MG/ML) 5 ML SYRINGE
INTRAMUSCULAR | Status: AC
  Filled 2020-07-06: qty 5

## 2020-07-06 NOTE — Op Note (Signed)
[facility] Patient Name: Bradley Howard Procedure Date: 07/06/2020 12:23 PM MRN: [id number] Date of Birth: January 23, 1961 Attending MD: Hildred Laser , MD CSN: [id number] Age: 59 Admit Type: Outpatient Procedure:                Colonoscopy Indications:              High risk colon cancer surveillance: Personal                            history of colonic polyps Providers:                Hildred Laser, MD, [place] Page, Raphael Gibney,                            Technician, Kristine L. Risa Grill, Technician Referring MD:             Ledell Noss internal medicine and Adelina Mings, MD Medicines:                Propofol per Anesthesia Complications:            No immediate complications. Estimated Blood Loss:     Estimated blood loss was minimal. Procedure:                Pre-Anesthesia Assessment:                           - Prior to the procedure, a History and Physical                            was performed, and patient medications and                            allergies were reviewed. The patient's tolerance of                            previous anesthesia was also reviewed. The risks                            and benefits of the procedure and the sedation                            options and risks were discussed with the patient.                            All questions were answered, and informed consent                            was obtained. Prior Anticoagulants: The patient has                            taken no previous anticoagulant or antiplatelet                            agents except for aspirin. ASA Grade Assessment: II                            -  A patient with mild systemic disease. After                            reviewing the risks and benefits, the patient was                            deemed in satisfactory condition to undergo the                            procedure.                           After obtaining informed consent, the colonoscope                             was passed under direct vision. Throughout the                            procedure, the patient's blood pressure, pulse, and                            oxygen saturations were monitored continuously. The                            [id number]) was introduced through the anus                            and advanced to the the cecum, identified by                            appendiceal orifice and ileocecal valve. The                            colonoscopy was performed without difficulty. The                            patient tolerated the procedure well. The quality                            of the bowel preparation was adequate to identify                            polyps. The ileocecal valve, appendiceal orifice,                            and rectum were photographed. Scope In: 12:44:10 PM Scope Out: 1:12:47 PM Scope Withdrawal Time: 0 hours 24 minutes 4 seconds  Total Procedure Duration: 0 hours 28 minutes 37 seconds  Findings:      The perianal and digital rectal examinations were normal.      Five polyps were found in the sigmoid colon, transverse colon, hepatic       flexure and ascending colon. The polyps were small in size. These polyps       were removed with a cold snare. Resection and retrieval were complete.  The pathology specimen was placed into Bottle Number 1.      A small polyp was found in the hepatic flexure. Biopsies were taken with       a cold forceps for histology. The pathology specimen was placed into       Bottle Number 1.      A few small-mouthed diverticula were found in the sigmoid colon.      The retroflexed view of the distal rectum and anal verge was normal and       showed no anal or rectal abnormalities. Impression:               - Five small polyps in the sigmoid colon, in the                            transverse colon, at the hepatic flexure and in the                            ascending colon, removed with a cold  snare.                            Resected and retrieved.                           - One small polyp at the hepatic flexure. Biopsied.                           - Diverticulosis in the sigmoid colon. Moderate Sedation:      Per Anesthesia Care Recommendation:           - Patient has a contact number available for                            emergencies. The signs and symptoms of potential                            delayed complications were discussed with the                            patient. Return to normal activities tomorrow.                            Written discharge instructions were provided to the                            patient.                           - High fiber diet today.                           - Continue present medications.                           - No aspirin, ibuprofen, naproxen, or other  non-steroidal anti-inflammatory drugs for 1 day.                           - Await pathology results.                           - Repeat colonoscopy for surveillance based on                            pathology results. Procedure Code(s):        --- Professional ---                           [phone], Colonoscopy, flexible; with removal of                            tumor(s), polyp(s), or other lesion(s) by snare                            technique                           45380, 26, Colonoscopy, flexible; with biopsy,                            single or multiple Diagnosis Code(s):        --- Professional ---                           K63.5, Polyp of colon                           Z86.010, Personal history of colonic polyps                           K57.30, Diverticulosis of large intestine without                            perforation or abscess without bleeding CPT copyright 2019 American Medical Association. All rights reserved. The codes documented in this report are preliminary and upon coder review may  be revised to meet current  compliance requirements. Hildred Laser, MD Hildred Laser, MD 07/06/2020 1:22:28 PM This report has been signed electronically. Number of Addenda: 0

## 2020-07-06 NOTE — Transfer of Care (Signed)
Immediate Anesthesia Transfer of Care Note  Patient: Bradley Howard  Procedure(s) Performed: COLONOSCOPY WITH PROPOFOL (N/A ) POLYPECTOMY  Patient Location: PACU  Anesthesia Type:General  Level of Consciousness: awake  Airway & Oxygen Therapy: Patient Spontanous Breathing  Post-op Assessment: Report given to RN and Post -op Vital signs reviewed and stable  Post vital signs: Reviewed and stable  Last Vitals:  Vitals Value Taken Time  BP    Temp    Pulse    Resp    SpO2      Last Pain:  Vitals:   07/06/20 1238  TempSrc:   PainSc: 5       Patients Stated Pain Goal: 4 (09/73/53 2992)  Complications: No complications documented.

## 2020-07-06 NOTE — Discharge Instructions (Signed)
No aspirin or NSAIDs for 24 hours. Resume usual medications as before. High-fiber diet. No driving for 24 hours. Physician will call with biopsy results and further recommendations.   Colon Polyps  Polyps are tissue growths inside the body. Polyps can grow in many places, including the large intestine (colon). A polyp may be a round bump or a mushroom-shaped growth. You could have one polyp or several. Most colon polyps are noncancerous (benign). However, some colon polyps can become cancerous over time. Finding and removing the polyps early can help prevent this. What are the causes? The exact cause of colon polyps is not known. What increases the risk? You are more likely to develop this condition if you:  Have a family history of colon cancer or colon polyps.  Are older than 57 or older than 45 if you are African American.  Have inflammatory bowel disease, such as ulcerative colitis or Crohn's disease.  Have certain hereditary conditions, such as: ? Familial adenomatous polyposis. ? Lynch syndrome. ? Turcot syndrome. ? Peutz-Jeghers syndrome.  Are overweight.  Smoke cigarettes.  Do not get enough exercise.  Drink too much alcohol.  Eat a diet that is high in fat and red meat and low in fiber.  Had childhood cancer that was treated with abdominal radiation. What are the signs or symptoms? Most polyps do not cause symptoms. If you have symptoms, they may include:  Blood coming from your rectum when having a bowel movement.  Blood in your stool. The stool may look dark red or black.  Abdominal pain.  A change in bowel habits, such as constipation or diarrhea. How is this diagnosed? This condition is diagnosed with a colonoscopy. This is a procedure in which a lighted, flexible scope is inserted into the anus and then passed into the colon to examine the area. Polyps are sometimes found when a colonoscopy is done as part of routine cancer screening tests. How is  this treated? Treatment for this condition involves removing any polyps that are found. Most polyps can be removed during a colonoscopy. Those polyps will then be tested for cancer. Additional treatment may be needed depending on the results of testing. Follow these instructions at home: Lifestyle  Maintain a healthy weight, or lose weight if recommended by your health care provider.  Exercise every day or as told by your health care provider.  Do not use any products that contain nicotine or tobacco, such as cigarettes and e-cigarettes. If you need help quitting, ask your health care provider.  If you drink alcohol, limit how much you have: ? 0-1 drink a day for women. ? 0-2 drinks a day for men.  Be aware of how much alcohol is in your drink. In the [place] one drink equals one 12 oz bottle of beer (355 mL), one 5 oz glass of wine (148 mL), or one 1 oz shot of hard liquor (44 mL). Eating and drinking   Eat foods that are high in fiber, such as fruits, vegetables, and whole grains.  Eat foods that are high in calcium and vitamin D, such as milk, cheese, yogurt, eggs, liver, fish, and broccoli.  Limit foods that are high in fat, such as fried foods and desserts.  Limit the amount of red meat and processed meat you eat, such as hot dogs, sausage, bacon, and lunch meats. General instructions  Keep all follow-up visits as told by your health care provider. This is important. ? This includes having regularly scheduled colonoscopies. ? Talk  to your health care provider about when you need a colonoscopy. Contact a health care provider if:  You have new or worsening bleeding during a bowel movement.  You have new or increased blood in your stool.  You have a change in bowel habits.  You lose weight for no known reason. Summary  Polyps are tissue growths inside the body. Polyps can grow in many places, including the colon.  Most colon polyps are noncancerous (benign), but some can  become cancerous over time.  This condition is diagnosed with a colonoscopy.  Treatment for this condition involves removing any polyps that are found. Most polyps can be removed during a colonoscopy. This information is not intended to replace advice given to you by your health care provider. Make sure you discuss any questions you have with your health care provider. Document Revised: 01/23/2018 Document Reviewed: 01/23/2018 Elsevier Patient Education  [place].    Colonoscopy, Adult, Care After This sheet gives you information about how to care for yourself after your procedure. Your health care provider may also give you more specific instructions. If you have problems or questions, contact your health care provider. What can I expect after the procedure? After the procedure, it is common to have:  A small amount of blood in your stool for 24 hours after the procedure.  Some gas.  Mild cramping or bloating of your abdomen. Follow these instructions at home: Eating and drinking   Drink enough fluid to keep your urine pale yellow.  Follow instructions from your health care provider about eating or drinking restrictions.  Resume your normal diet as instructed by your health care provider. Avoid heavy or fried foods that are hard to digest. Activity  Rest as told by your health care provider.  Avoid sitting for a long time without moving. Get up to take short walks every 1-2 hours. This is important to improve blood flow and breathing. Ask for help if you feel weak or unsteady.  Return to your normal activities as told by your health care provider. Ask your health care provider what activities are safe for you. Managing cramping and bloating   Try walking around when you have cramps or feel bloated.  Apply heat to your abdomen as told by your health care provider. Use the heat source that your health care provider recommends, such as a moist heat pack or a heating  pad. ? Place a towel between your skin and the heat source. ? Leave the heat on for 20-30 minutes. ? Remove the heat if your skin turns bright red. This is especially important if you are unable to feel pain, heat, or cold. You may have a greater risk of getting burned. General instructions  For the first 24 hours after the procedure: ? Do not drive or use machinery. ? Do not sign important documents. ? Do not drink alcohol. ? Do your regular daily activities at a slower pace than normal. ? Eat soft foods that are easy to digest.  Take over-the-counter and prescription medicines only as told by your health care provider.  Keep all follow-up visits as told by your health care provider. This is important. Contact a health care provider if:  You have blood in your stool 2-3 days after the procedure. Get help right away if you have:  More than a small spotting of blood in your stool.  Large blood clots in your stool.  Swelling of your abdomen.  Nausea or vomiting.  A  fever.  Increasing pain in your abdomen that is not relieved with medicine. Summary  After the procedure, it is common to have a small amount of blood in your stool. You may also have mild cramping and bloating of your abdomen.  For the first 24 hours after the procedure, do not drive or use machinery, sign important documents, or drink alcohol.  Get help right away if you have a lot of blood in your stool, nausea or vomiting, a fever, or increased pain in your abdomen. This information is not intended to replace advice given to you by your health care provider. Make sure you discuss any questions you have with your health care provider. Document Revised: 05/04/2019 Document Reviewed: 05/04/2019 Elsevier Patient Education  [place].   PATIENT INSTRUCTIONS POST-ANESTHESIA  IMMEDIATELY FOLLOWING SURGERY:  Do not drive or operate machinery for the first twenty four hours after surgery.  Do not make any  important decisions for twenty four hours after surgery or while taking narcotic pain medications or sedatives.  If you develop intractable nausea and vomiting or a severe headache please notify your doctor immediately.  FOLLOW-UP:  Please make an appointment with your surgeon as instructed. You do not need to follow up with anesthesia unless specifically instructed to do so.  WOUND CARE INSTRUCTIONS (if applicable):  Keep a dry clean dressing on the anesthesia/puncture wound site if there is drainage.  Once the wound has quit draining you may leave it open to air.  Generally you should leave the bandage intact for twenty four hours unless there is drainage.  If the epidural site drains for more than 36-48 hours please call the anesthesia department.  QUESTIONS?:  Please feel free to call your physician or the hospital operator if you have any questions, and they will be happy to assist you.

## 2020-07-06 NOTE — Progress Notes (Signed)
Dr Laural Golden at bedside to talk with pt. Cleared for d/c home.

## 2020-07-06 NOTE — Anesthesia Postprocedure Evaluation (Signed)
Anesthesia Post Note  Patient: Bradley Howard  Procedure(s) Performed: COLONOSCOPY WITH PROPOFOL (N/A ) POLYPECTOMY  Patient location during evaluation: PACU Anesthesia Type: General Level of consciousness: awake Pain management: pain level controlled Vital Signs Assessment: post-procedure vital signs reviewed and stable Respiratory status: spontaneous breathing Cardiovascular status: stable Postop Assessment: no apparent nausea or vomiting Anesthetic complications: no   No complications documented.   Last Vitals:  Vitals:   07/06/20 1145  BP: (!) 155/90  Pulse: 72  Resp: 17  Temp: 36.9 C  SpO2: 94%    Last Pain:  Vitals:   07/06/20 1238  TempSrc:   PainSc: [place]

## 2020-07-06 NOTE — H&P (Signed)
Bradley Howard is an 59 y.o. male.   Chief Complaint: Patient is here for colonoscopy. HPI: Patient is 59 year old Caucasian male who underwent colonoscopy in April 2021 and had 3 polyps removed.  Exam however was incomplete.  He also had EGD at that time revealing duodenal ulcer felt to be secondary to NSAID.  He is on PPI.  His hemoglobin is now normal.  He denies abdominal pain change in bowel habits or rectal bleeding. Family history significant for colon carcinoma and paternal as well as maternal grandfathers and they were both in their late 44s or 41s.  One of his uncles also had colon carcinoma at 66.  Past Medical History:  Diagnosis Date  . Hyper IgM syndrome (HCC)    WBC disorder  . Non Hodgkin's lymphoma ([place]) 2010    Past Surgical History:  Procedure Laterality Date  . CHOLECYSTECTOMY    . GASTRIC BYPASS    . HERNIA REPAIR    . TESTICLE TORSION REDUCTION      Family History  Problem Relation Age of Onset  . Heart attack Mother   . Heart attack Father   . Alzheimer's disease Maternal Grandmother   . Cancer Maternal Grandfather   . Cancer Paternal Grandfather    Social History:  reports that he has been smoking cigarettes. He has a 22.00 pack-year smoking history. He has never used smokeless tobacco. He reports current alcohol use of about 6.0 standard drinks of alcohol per week. He reports that he does not use drugs.  Allergies:  Allergies  Allergen Reactions  . Terbinafine And Related Palpitations    Medications Prior to Admission  Medication Sig Dispense Refill  . acetaminophen (TYLENOL) 500 MG tablet Take 500 mg by mouth every 6 (six) hours as needed for moderate pain or headache.     . cyanocobalamin (,VITAMIN B-12,) 1000 MCG/ML injection Inject 1,000 mcg into the muscle every 30 (thirty) days.     Marland Kitchen loratadine (CLARITIN) 10 MG tablet Take 10 mg by mouth daily.    Marland Kitchen omeprazole (PRILOSEC) 20 MG capsule Take 20 mg by mouth daily.      Results for orders  placed or performed during the hospital encounter of 07/04/20 (from the past 48 hour(s))  SARS CORONAVIRUS 2 (TAT 6-24 HRS) Nasopharyngeal Nasopharyngeal Swab     Status: None   Collection Time: 07/04/20  1:19 PM   Specimen: Nasopharyngeal Swab  Result Value Ref Range   SARS Coronavirus 2 NEGATIVE NEGATIVE    Comment: (NOTE) SARS-CoV-2 target nucleic acids are NOT DETECTED.  The SARS-CoV-2 RNA is generally detectable in upper and lower respiratory specimens during the acute phase of infection. Negative results do not preclude SARS-CoV-2 infection, do not rule out co-infections with other pathogens, and should not be used as the sole basis for treatment or other patient management decisions. Negative results must be combined with clinical observations, patient history, and epidemiological information. The expected result is Negative.  Fact Sheet for Patients: SugarRoll.be  Fact Sheet for Healthcare Providers: https://www.woods-mathews.com/  This test is not yet approved or cleared by the [place] FDA and  has been authorized for detection and/or diagnosis of SARS-CoV-2 by FDA under an Emergency Use Authorization (EUA). This EUA will remain  in effect (meaning this test can be used) for the duration of the COVID-19 declaration under Se ction 564(b)(1) of the Act, 21 U.S.C. section 360bbb-3(b)(1), unless the authorization is terminated or revoked sooner.  Performed at [facility], [place]  [address], [place], [place] [address]   CBC with Differential/Platelet     Status: Abnormal   Collection Time: 07/04/20  1:23 PM  Result Value Ref Range   WBC 13.2 (H) 4.0 - 10.5 K/uL   RBC 4.36 4.22 - 5.81 MIL/uL   Hemoglobin 13.1 13.0 - 17.0 g/dL   HCT 40.2 39 - 52 %   MCV 92.2 80.0 - 100.0 fL   MCH 30.0 26.0 - 34.0 pg   MCHC 32.6 30.0 - 36.0 g/dL   RDW 13.6 11.5 - 15.5 %   Platelets 208 150 - 400 K/uL   nRBC 0.0 0.0 - 0.2 %    Neutrophils Relative % 33 %   Neutro Abs 4.3 1.7 - 7.7 K/uL   Lymphocytes Relative 51 %   Lymphs Abs 6.7 (H) 0.7 - 4.0 K/uL   Monocytes Relative 15 %   Monocytes Absolute 2.0 (H) 0 - 1 K/uL   Eosinophils Relative 1 %   Eosinophils Absolute 0.1 0 - 0 K/uL   Basophils Relative 0 %   Basophils Absolute 0.1 0 - 0 K/uL   Immature Granulocytes 0 %   Abs Immature Granulocytes 0.01 0.00 - 0.07 K/uL    Comment: Performed at [facility], [address], [place], [place] [address]   No results found.  Review of Systems  Blood pressure (!) 155/90, pulse 72, temperature 98.5 F (36.9 C), temperature source Oral, resp. rate 17, height 6\' 1"  (1.854 m), weight 77 kg, SpO2 94 %. Physical Exam HENT:     Mouth/Throat:     Mouth: Mucous membranes are moist.     Pharynx: Oropharynx is clear.  Eyes:     General: No scleral icterus.    Conjunctiva/sclera: Conjunctivae normal.  Cardiovascular:     Rate and Rhythm: Normal rate and regular rhythm.     Heart sounds: Normal heart sounds. No murmur heard.   Pulmonary:     Effort: Pulmonary effort is normal.     Breath sounds: Normal breath sounds.  Abdominal:     Comments: Abdomen is symmetrical.  He has large tattoo at  left side of his abdomen.  Diminished soft and nontender with organomegaly or masses.  Musculoskeletal:        General: No swelling.     Cervical back: Neck supple.  Lymphadenopathy:     Cervical: No cervical adenopathy.  Skin:    General: Skin is warm and dry.     Comments: He has multiple tattoos over his body.  Neurological:     Mental Status: He is alert.      Assessment/Plan History of colonic polyps. Last colonoscopy incomplete in April 2021 at outside facility. Family history of colon carcinoma in multiple second-degree relatives. Surveillance colonoscopy.  Hildred Laser, MD 07/06/2020, 12:32 PM

## 2020-07-06 NOTE — Anesthesia Preprocedure Evaluation (Signed)
Anesthesia Evaluation  Patient identified by MRN, date of birth, ID band Patient awake    Reviewed: Allergy & Precautions, NPO status , Patient's Chart, lab work & pertinent test results  History of Anesthesia Complications Negative for: history of anesthetic complications  Airway Mallampati: II  TM Distance: >3 FB Neck ROM: Full    Dental  (+) Dental Advisory Given, Teeth Intact   Pulmonary Current Smoker and Patient abstained from smoking.,    Pulmonary exam normal breath sounds clear to auscultation       Cardiovascular Exercise Tolerance: Good negative cardio ROS Normal cardiovascular exam Rhythm:Regular Rate:Normal     Neuro/Psych negative neurological ROS  negative psych ROS   GI/Hepatic (+)     substance abuse  alcohol use, Gastric bypass   Endo/Other    Renal/GU   negative genitourinary   Musculoskeletal Back pain after colonoscopy   Abdominal   Peds negative pediatric ROS (+)  Hematology Non hodgekin's lymphoma   Anesthesia Other Findings Hyper IgM syndrome Back pain after colonoscopy   Reproductive/Obstetrics negative OB ROS                            Anesthesia Physical Anesthesia Plan  ASA: II  Anesthesia Plan: General   Post-op Pain Management:    Induction: Intravenous  PONV Risk Score and Plan: Ondansetron  Airway Management Planned: Nasal Cannula and Natural Airway  Additional Equipment:   Intra-op Plan:   Post-operative Plan:   Informed Consent:     Dental advisory given  Plan Discussed with: CRNA and Surgeon  Anesthesia Plan Comments:         Anesthesia Quick Evaluation

## 2020-07-08 LAB — SURGICAL PATHOLOGY

## 2020-07-11 ENCOUNTER — Encounter (HOSPITAL_COMMUNITY): Payer: Self-pay | Admitting: Internal Medicine

## 2020-07-29 ENCOUNTER — Encounter (INDEPENDENT_AMBULATORY_CARE_PROVIDER_SITE_OTHER): Payer: Self-pay | Admitting: *Deleted

## 2020-12-21 ENCOUNTER — Telehealth: Payer: Self-pay | Admitting: Hematology

## 2020-12-21 NOTE — Telephone Encounter (Signed)
I received a pt referral from [facility] Internal Medicine for hyper IgM syndrome. Bradley Howard has been scheduled to see Dr. Irene Limbo on 3/15 at 1pm. Appt date and time has been given to the pt's wife.

## 2021-01-02 NOTE — Progress Notes (Signed)
HEMATOLOGY/ONCOLOGY CONSULTATION NOTE  Date of Service: 01/03/2021  Patient Care Team: Medicine, [facility] as PCP - General (Internal Medicine)  CHIEF COMPLAINTS/PURPOSE OF CONSULTATION:  Hyper IgM Syndrome  HISTORY OF PRESENTING ILLNESS:   Bradley Howard is a wonderful 60 y.o. male who has been referred to [place] by [facility] for evaluation and management of Hyper IgM syndrome.The pt reports that he is doing well overall.  The pt reports that he was seen at [facility] in [place] for lymphocytosis. The pt reports he used to be very busy and this has been dropping off very quickly since the middle of last summer. 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 The pt notes that now if he sits down, he goes straight to sleep and only desires to rest after work. The pt notes he also experiences intermittent spinning dizziness for a few seconds at a time. The pt notes that he cut his finger on a lawn mower blade almost two years ago. He notes that since then he has been experiencing a loss of fingernails in that right hand, but not in the other hand or toes. The pt ws told this was a fungal infection. The pt saw a dermatologist and the antifungal they gave hum made him feel as though he was going to have a heart attack, around January 2022. The pt stopped after a week or so. The pt notes he never went back because they were saying he drank too much alcohol and that was the issue. He does not desire to go back due to their rudeness.  The pt notes that in 2020 he was diagnosed with non-Hodgkin's Lymphoma at [facility] in [place]. 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 The pt notes that his WBC have been elevated since at least 2017. The pt notes that he has never needed treatment for this. 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 The pt had a bone marrow biopsy in March 2020.   The pt notes he has had a history of sinus infections due to allergies. He has had a gall bladder surgery, gastric bypass for indigestion, testical torsion, and hernia repair. He goes to [place] for a steroid injection for a bulging disc in his back. The pt denies any frequent ear or skin infections. The pt notes he has been smoking a pack a day since he was 60 years old. The pt notes he hunts, fishes, and plays golf. The pt is on B12 injections monthly starting this year and Lisinopril. The pt takes a daily multivitamin. The pt denies any thyroid issues. The pt notes that cancers and heart attacks runs in both sides of his family. His paternal and maternal died of prostate and colon cancer. The pt's uncle died of Diverticulitis and Crohn's disease. The pt notes that there have been several deaths due to heart attacks on both sides.  The pt notes that at one point he was suggested light treatment, but nothing came about. The pt notes that he has had CT and PET scans performed in [place], but is unsure of when they were.  On review of systems, pt reports fatigue, dizziness, fungal infection in fingernail and denies sudden weight loss, fevers, chills,  drenching night sweats, new lumps/bumps, infection issues, and any other symptoms.  MEDICAL HISTORY:  Past Medical History:  Diagnosis Date  . Hyper IgM syndrome (HCC)    WBC disorder  . Non Hodgkin's lymphoma ([place]) 2010    SURGICAL HISTORY: Past Surgical History:  Procedure Laterality Date  . CHOLECYSTECTOMY    . COLONOSCOPY WITH PROPOFOL N/A 07/06/2020   Procedure: COLONOSCOPY WITH PROPOFOL;  Surgeon: Rogene Houston, MD;  Location: [facility];  Service: Endoscopy;  Laterality: N/A;  125  . GASTRIC BYPASS    . HERNIA REPAIR    . POLYPECTOMY  07/06/2020   Procedure: POLYPECTOMY;  Surgeon: Rogene Houston, MD;  Location: [facility] END[facility]  SUITE;  Service: Endoscopy;;  . TESTICLE TORSION REDUCTION      SOCIAL HISTORY: Social History   Socioeconomic History  . Marital status: Married    Spouse name: Not on file  . Number of children: Not on file  . Years of education: Not on file  . Highest education level: Not on file  Occupational History  . Not on file  Tobacco Use  . Smoking status: Current Every Day Smoker    Packs/day: 0.50    Years: 44.00    Pack years: 22.00    Types: Cigarettes  . Smokeless tobacco: Never Used  Vaping Use  . Vaping Use: Never used  Substance and Sexual Activity  . Alcohol use: Yes    Alcohol/week: 6.0 standard drinks    Types: 6 Cans of beer per week  . Drug use: Never  . Sexual activity: Not on file  Other Topics Concern  . Not on file  Social History Narrative  . Not on file   Social Determinants of Health   Financial Resource Strain: Not on file  Food Insecurity: Not on file  Transportation Needs: Not on file  Physical Activity: Not on file  Stress: Not on file  Social Connections: Not on file  Intimate Partner Violence: Not on file    FAMILY HISTORY: Family History  Problem Relation Age of Onset  . Heart attack Mother   . Heart attack Father   . Alzheimer's disease Maternal Grandmother   . Cancer Maternal Grandfather   . Cancer Paternal Grandfather     ALLERGIES:  is allergic to terbinafine and related.  MEDICATIONS:  Current Outpatient Medications  Medication Sig Dispense Refill  . acetaminophen (TYLENOL) 500 MG tablet Take 500 mg by mouth every 6 (six) hours as needed for moderate pain or headache.     . cyanocobalamin (,VITAMIN B-12,) 1000 MCG/ML injection Inject 1,000 mcg into the muscle every 30 (thirty) days.     Marland Kitchen loratadine (CLARITIN) 10 MG tablet Take 10 mg by mouth daily.    Marland Kitchen omeprazole (PRILOSEC) 20 MG capsule Take 20 mg by mouth daily.     No current facility-administered medications for this visit.    REVIEW OF SYSTEMS:   10 Point review of  Systems was done is negative except as noted above.  PHYSICAL EXAMINATION: ECOG PERFORMANCE STATUS: 1 - Symptomatic but completely ambulatory  . Vitals:   01/03/21 1314  BP: (!) 156/95  Pulse: 81  Resp: 18  Temp: (!) 97.3 F (36.3 C)  SpO2: 100%   Filed Weights   01/03/21 1314  Weight: 171 lb 1.6 oz (77.6 kg)   .Body mass index is 22.57 kg/m.   GENERAL:alert, in no acute distress and comfortable SKIN: no acute rashes, no significant lesions EYES: conjunctiva are pink and non-injected,  sclera anicteric OROPHARYNX: MMM, no exudates, no oropharyngeal erythema or ulceration NECK: supple, no JVD LYMPH:  no palpable lymphadenopathy in the cervical, axillary or inguinal regions LUNGS: clear to auscultation b/l with normal respiratory effort HEART: regular rate & rhythm ABDOMEN:  normoactive bowel sounds , non tender, not distended. Extremity: no pedal edema PSYCH: alert & oriented x 3 with fluent speech NEURO: no focal motor/sensory deficits  LABORATORY DATA:  I have reviewed the data as listed  CBC Latest Ref Rng & Units 01/03/2021 07/04/2020 02/25/2018  WBC 4.0 - 10.5 K/uL 16.6(H) 13.2(H) 13.8(H)  Hemoglobin 13.0 - 17.0 g/dL 14.3 13.1 13.4  Hematocrit 39.0 - 52.0 % 43.1 40.2 41.0  Platelets 150 - 400 K/uL 199 208 182    CMP Latest Ref Rng & Units 01/03/2021 02/25/2018  Glucose 70 - 99 mg/dL 93 97  BUN 6 - 20 mg/dL 15 14  Creatinine 0.61 - 1.24 mg/dL 1.11 1.05  Sodium 135 - 145 mmol/L 140 137  Potassium 3.5 - 5.1 mmol/L 3.8 3.9  Chloride 98 - 111 mmol/L 103 102  CO2 22 - 32 mmol/L 26 26  Calcium 8.9 - 10.3 mg/dL 9.9 9.2  Total Protein 6.5 - 8.1 g/dL 8.2(H) 7.0  Total Bilirubin 0.3 - 1.2 mg/dL 0.5 0.5  Alkaline Phos 38 - 126 U/L 103 85  AST 15 - 41 U/L 18 18  ALT 0 - 44 U/L 19 15(L)   . Lab Results  Component Value Date   LDH 156 01/03/2021    RADIOGRAPHIC STUDIES: I have personally reviewed the radiological images as listed and agreed with the findings in the  report. No results found.  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 -Advised pt that his iron levels were slightly lower, which could cause fatigue.  -Discussed pt's history of labwork. Advised that mostly just his WBC elevated and not other counts.  -Advised pt that most WBC elevated in his case were lymphocytes. We want to know if this is reactive or clonal. The pt had majority of polyclonal lymphocytosis, which is an excessive reaction of lymphocytosis due to smoking. -Advised pt that there was a small percentage of clonal found that looked like monoclonal B Lymphocytosis. The number of cells did not meet definition of CLL. This is usually a very slow growing process that is just monitored unless progression or symptomatic. -Advised pt that his recent counts did not suggest anemia, nor was there an increase in lymphocytes. -Advised pt that we would need a CT scan to observe if lymph nodes on inside. Do not suggest a repeat Bm Bx at this time. -Advised that suggestion of recurrent infection was the driver behind antibody testing. The pt has Hyper IgM Syndrome. This can increase the risk of certain types of infections and reduce effectiveness of certain vaccines. -Advised pt that we can replace the IgG by IV if needed. This would be replaced when levels so that levels are in the 5-600 . The pt is at 300. -Recommended pt decrease smoking and work towards smoking cessaetion. -Recommended pt stay up to date with all age related screenings. The pt had a colonoscopy recently and they removed two polyps.  -Advised pt that gastric bypass would be risk factor for iron and B12 deficiency,  as well as Vitamin D. -Will get repeat IgM levels and iron levels. -Discussed possibility  of IV Iron if levels low. The pt desires this option. -Will get [facility] at [facility] in 1 week. -Will see back via phone in 2 weeks. The pt desires appointments later in the day if possible due to working at 4 am.   FOLLOW UP: Labs today CT chest/abd/pelvis at [facility] in 1 week Phone visit with Dr Irene Limbo in 2 weeks    All of the patients questions were answered with apparent satisfaction. The patient knows to call the clinic with any problems, questions or concerns.  I spent 40 minutes counseling the patient face to face. The total time spent in the appointment was 60 minutes and more than 50% was on counseling and direct patient cares.    Sullivan Lone MD [place] AAHIVMS [facility] Hematology/Oncology Physician [facility]  (Office):       [phone] (Work cell):  [phone] (Fax):           [phone]  01/03/2021 2:53 PM  I, Reinaldo Raddle, am acting as scribe for Dr. Sullivan Lone, MD.   .I have reviewed the above documentation for accuracy and completeness, and I agree with the above. Brunetta Genera MD

## 2021-01-03 ENCOUNTER — Inpatient Hospital Stay: Payer: BC Managed Care – PPO

## 2021-01-03 ENCOUNTER — Inpatient Hospital Stay: Payer: BC Managed Care – PPO | Attending: Hematology | Admitting: Hematology

## 2021-01-03 ENCOUNTER — Other Ambulatory Visit: Payer: Self-pay

## 2021-01-03 VITALS — BP 156/95 | HR 81 | Temp 97.3°F | Resp 18 | Ht 73.0 in | Wt 171.1 lb

## 2021-01-03 DIAGNOSIS — D72820 Lymphocytosis (symptomatic): Secondary | ICD-10-CM

## 2021-01-03 DIAGNOSIS — Z86010 Personal history of colonic polyps: Secondary | ICD-10-CM | POA: Diagnosis not present

## 2021-01-03 DIAGNOSIS — E611 Iron deficiency: Secondary | ICD-10-CM | POA: Diagnosis not present

## 2021-01-03 DIAGNOSIS — Z9884 Bariatric surgery status: Secondary | ICD-10-CM | POA: Insufficient documentation

## 2021-01-03 DIAGNOSIS — Z800 Family history of malignant neoplasm of digestive organs: Secondary | ICD-10-CM | POA: Insufficient documentation

## 2021-01-03 DIAGNOSIS — D805 Immunodeficiency with increased immunoglobulin M [IgM]: Secondary | ICD-10-CM

## 2021-01-03 DIAGNOSIS — F17210 Nicotine dependence, cigarettes, uncomplicated: Secondary | ICD-10-CM | POA: Insufficient documentation

## 2021-01-03 DIAGNOSIS — Z8042 Family history of malignant neoplasm of prostate: Secondary | ICD-10-CM | POA: Insufficient documentation

## 2021-01-03 DIAGNOSIS — E538 Deficiency of other specified B group vitamins: Secondary | ICD-10-CM

## 2021-01-03 DIAGNOSIS — D509 Iron deficiency anemia, unspecified: Secondary | ICD-10-CM

## 2021-01-03 LAB — CBC WITH DIFFERENTIAL/PLATELET
Abs Immature Granulocytes: 0.03 10*3/uL (ref 0.00–0.07)
Basophils Absolute: 0.1 10*3/uL (ref 0.0–0.1)
Basophils Relative: 0 %
Eosinophils Absolute: 0.1 10*3/uL (ref 0.0–0.5)
Eosinophils Relative: 0 %
HCT: 43.1 % (ref 39.0–52.0)
Hemoglobin: 14.3 g/dL (ref 13.0–17.0)
Immature Granulocytes: 0 %
Lymphocytes Relative: 60 %
Lymphs Abs: 9.8 10*3/uL — ABNORMAL HIGH (ref 0.7–4.0)
MCH: 30.2 pg (ref 26.0–34.0)
MCHC: 33.2 g/dL (ref 30.0–36.0)
MCV: 91.1 fL (ref 80.0–100.0)
Monocytes Absolute: 1.8 10*3/uL — ABNORMAL HIGH (ref 0.1–1.0)
Monocytes Relative: 11 %
Neutro Abs: 4.9 10*3/uL (ref 1.7–7.7)
Neutrophils Relative %: 29 %
Platelets: 199 10*3/uL (ref 150–400)
RBC: 4.73 MIL/uL (ref 4.22–5.81)
RDW: 13.4 % (ref 11.5–15.5)
WBC: 16.6 10*3/uL — ABNORMAL HIGH (ref 4.0–10.5)
nRBC: 0 % (ref 0.0–0.2)

## 2021-01-03 LAB — CMP (CANCER CENTER ONLY)
ALT: 19 U/L (ref 0–44)
AST: 18 U/L (ref 15–41)
Albumin: 4.4 g/dL (ref 3.5–5.0)
Alkaline Phosphatase: 103 U/L (ref 38–126)
Anion gap: 11 (ref 5–15)
BUN: 15 mg/dL (ref 6–20)
CO2: 26 mmol/L (ref 22–32)
Calcium: 9.9 mg/dL (ref 8.9–10.3)
Chloride: 103 mmol/L (ref 98–111)
Creatinine: 1.11 mg/dL (ref 0.61–1.24)
GFR, Estimated: >60 mL/min (ref >60)
Glucose, Bld: 93 mg/dL (ref 70–99)
Potassium: 3.8 mmol/L (ref 3.5–5.1)
Sodium: 140 mmol/L (ref 135–145)
Total Bilirubin: 0.5 mg/dL (ref 0.3–1.2)
Total Protein: 8.2 g/dL — ABNORMAL HIGH (ref 6.5–8.1)

## 2021-01-03 LAB — LACTATE DEHYDROGENASE: LDH: 156 U/L (ref 98–192)

## 2021-01-03 LAB — SEDIMENTATION RATE: Sed Rate: 26 mm/hr — ABNORMAL HIGH (ref 0–16)

## 2021-01-03 LAB — VITAMIN B12: Vitamin B-12: 296 pg/mL (ref 180–914)

## 2021-01-04 ENCOUNTER — Telehealth: Payer: Self-pay | Admitting: Hematology

## 2021-01-04 LAB — IRON AND TIBC
Iron: 94 ug/dL (ref 42–163)
Saturation Ratios: 24 % (ref 20–55)
TIBC: 388 ug/dL (ref 202–409)
UIBC: 293 ug/dL (ref 117–376)

## 2021-01-04 LAB — FERRITIN: Ferritin: 40 ng/mL (ref 24–336)

## 2021-01-04 LAB — SURGICAL PATHOLOGY

## 2021-01-04 NOTE — Telephone Encounter (Signed)
Scheduled follow-up appointment per 3/15 los. Patient's wife is aware. 

## 2021-01-05 LAB — MULTIPLE MYELOMA PANEL, SERUM
Albumin SerPl Elph-Mcnc: 3.9 g/dL (ref 2.9–4.4)
Albumin/Glob SerPl: 1.2 (ref 0.7–1.7)
Alpha 1: 0.3 g/dL (ref 0.0–0.4)
Alpha2 Glob SerPl Elph-Mcnc: 1.1 g/dL — ABNORMAL HIGH (ref 0.4–1.0)
B-Globulin SerPl Elph-Mcnc: 1 g/dL (ref 0.7–1.3)
Gamma Glob SerPl Elph-Mcnc: 1.2 g/dL (ref 0.4–1.8)
Globulin, Total: 3.5 g/dL (ref 2.2–3.9)
IgA: 80 mg/dL — ABNORMAL LOW (ref 90–386)
IgG (Immunoglobin G), Serum: 412 mg/dL — ABNORMAL LOW (ref 603–1613)
IgM (Immunoglobulin M), Srm: 1166 mg/dL — ABNORMAL HIGH (ref 20–172)
Total Protein ELP: 7.4 g/dL (ref 6.0–8.5)

## 2021-01-05 LAB — FLOW CYTOMETRY

## 2021-01-10 ENCOUNTER — Encounter: Payer: Self-pay | Admitting: Hematology

## 2021-01-11 ENCOUNTER — Other Ambulatory Visit: Payer: Self-pay | Admitting: *Deleted

## 2021-01-16 NOTE — Progress Notes (Signed)
HEMATOLOGY/ONCOLOGY CONSULTATION NOTE  Date of Service: 01/17/2021  Patient Care Team: Medicine, Ledell Noss Internal as PCP - General (Internal Medicine)  CHIEF COMPLAINTS/PURPOSE OF CONSULTATION:  Hyper IgM Syndrome  HISTORY OF PRESENTING ILLNESS:   Bradley Howard is a wonderful 60 y.o. 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  The pt reports that he was seen at [facility] in [place] for lymphocytosis. The pt reports he used to be very busy and this has been dropping off very quickly since the middle of last summer. 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 The pt notes that now if he sits down, he goes straight to sleep and only desires to rest after work. The pt notes he also experiences intermittent spinning dizziness for a few seconds at a time. The pt notes that he cut his finger on a lawn mower blade almost two years ago. He notes that since then he has been experiencing a loss of fingernails in that right hand, but not in the other hand or toes. The pt ws told this was a fungal infection. The pt saw a dermatologist and the antifungal they gave hum made him feel as though he was going to have a heart attack, around January 2022. The pt stopped after a week or so. The pt notes he never went back because they were saying he drank too much alcohol and that was the issue. He does not desire to go back due to their rudeness.  The pt notes that in 2020 he was diagnosed with non-Hodgkin's Lymphoma at [facility] in [place]. 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 The pt notes that his WBC have been elevated since at least 2017. The pt notes that he has never needed treatment for this. 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 The pt had a bone marrow biopsy in March 2020.   The pt notes he has had a history of sinus infections due to allergies. He has had a gall bladder surgery, gastric bypass for indigestion, testical torsion, and hernia repair. He goes to [place] for a steroid injection for a bulging disc in his back. The pt denies any frequent ear or skin infections. The pt notes he has been smoking a pack a day since he was 60 years old. The pt notes he hunts, fishes, and plays golf. The pt is on B12 injections monthly starting this year and Lisinopril. The pt takes a daily multivitamin. The pt denies any thyroid issues. The pt notes that cancers and heart attacks runs in both sides of his family. His paternal and maternal died of prostate and colon cancer. The pt's uncle died of Diverticulitis and Crohn's disease. The pt notes that there have been several deaths due to heart attacks on both sides.  The pt notes that at one point he was suggested light treatment, but nothing came about. The pt notes that he has had CT and PET scans performed in [place], but is unsure of when they were.  On review of systems, pt reports fatigue, dizziness, fungal infection in fingernail and denies sudden weight loss, fevers, chills,  drenching night sweats, new lumps/bumps, infection issues, and any other symptoms.  INTERVAL HISTORY I connected with Lanice Shirts Howard on 01/17/2021 by telephone and verified that I am speaking with the correct person using two identifiers.   I discussed the limitations of evaluation and management by telemedicine. The patient expressed understanding and agreed to proceed.   Other persons participating in the visit and their role in the encounter:                                                         - Reinaldo Raddle, Medical Scribe     Patient's location: Home Provider's location: [place] at [employer] Howard is a wonderful 60 y.o.  male who is here today for evaluation and management of Hyper IgM syndrome. The patient's last visit with [place] was on 01/03/2021. The pt reports that he is doing well overall.  The pt reports no new symptoms or concerns. He notes the pollen has caused some seasonal allergies and congestion.  Of note since the patient's last visit, pt has had CT C/A/P ordered.  Lab results 01/03/2021 of CBC w/diff and CMP is as follows: all values are WNL except for WBC of 16.6K, Lymphs Abs of 9.8K, Monocytes Abs of 1.8K, Total Protein of 8.2. 01/03/2021 LDH of 156. 01/03/2021 MMP WNL except IgG of 412, IgA of 80, IgM of 1166, Alpha 2 Glob of 1.1. 01/03/2021 Sedimentation Rate of 26. 01/03/2021 Ferritin of 40. 01/03/2021 Iron of 94, Sat Ratio of 24. 01/03/2021 Vitamin B12 of 296.   On review of systems, pt reports seasonal allergies and denies any other symptoms.  MEDICAL HISTORY:  Past Medical History:  Diagnosis Date  . Hyper IgM syndrome (HCC)    WBC disorder  . Non Hodgkin's lymphoma ([place]) 2010    SURGICAL HISTORY: Past Surgical History:  Procedure Laterality Date  . CHOLECYSTECTOMY    . COLONOSCOPY WITH PROPOFOL N/A 07/06/2020   Procedure: COLONOSCOPY WITH PROPOFOL;  Surgeon: Rogene Houston, MD;  [facility];  Service: Endoscopy;  Laterality: N/A;  125  . GASTRIC BYPASS    . HERNIA REPAIR    . POLYPECTOMY  07/06/2020   Procedure: POLYPECTOMY;  Surgeon: Rogene Houston, MD;  Location: [facility];  Service: Endoscopy;;  . TESTICLE TORSION REDUCTION      SOCIAL HISTORY: Social History   Socioeconomic History  . Marital status: Married    Spouse name: Not on file  . Number of children: Not on file  . Years of education: Not on file  . Highest education level: Not on file  Occupational History  . Not on file  Tobacco Use  . Smoking status: Current Every Day Smoker    Packs/day: 0.50    Years: 44.00    Pack years: 22.00    Types: Cigarettes  . Smokeless tobacco: Never  Used  Vaping Use  . Vaping Use: Never used  Substance and Sexual Activity  . Alcohol use: Yes    Alcohol/week: 6.0 standard drinks    Types: 6 Cans of beer per week  . Drug use: Never  . Sexual activity: Not on file  Other Topics Concern  . Not on file  Social History Narrative  . Not on file   Social Determinants of Health  Financial Resource Strain: Not on file  Food Insecurity: Not on file  Transportation Needs: Not on file  Physical Activity: Not on file  Stress: Not on file  Social Connections: Not on file  Intimate Partner Violence: Not on file    FAMILY HISTORY: Family History  Problem Relation Age of Onset  . Heart attack Mother   . Heart attack Father   . Alzheimer's disease Maternal Grandmother   . Cancer Maternal Grandfather   . Cancer Paternal Grandfather     ALLERGIES:  is allergic to terbinafine and terbinafine and related.  MEDICATIONS:  Current Outpatient Medications  Medication Sig Dispense Refill  . acetaminophen (TYLENOL) 500 MG tablet Take 500 mg by mouth every 6 (six) hours as needed for moderate pain or headache.     . cyanocobalamin (,VITAMIN B-12,) 1000 MCG/ML injection Inject 1,000 mcg into the muscle every 30 (thirty) days.     Marland Kitchen losartan (COZAAR) 50 MG tablet     . Multiple Vitamin (MULTIVITAMINS PO)     . OIL OF OREGANO PO Take by mouth.     No current facility-administered medications for this visit.    REVIEW OF SYSTEMS:   10 Point review of Systems was done is negative except as noted above.  PHYSICAL EXAMINATION: ECOG PERFORMANCE STATUS: 1 - Symptomatic but completely ambulatory  . There were no vitals filed for this visit. There were no vitals filed for this visit. .There is no height or weight on file to calculate BMI.   Telehealth Visit.  LABORATORY DATA:  I have reviewed the data as listed  CBC Latest Ref Rng & Units 01/03/2021 07/04/2020 02/25/2018  WBC 4.0 - 10.5 K/uL 16.6(H) 13.2(H) 13.8(H)  Hemoglobin 13.0 - 17.0  g/dL 14.3 13.1 13.4  Hematocrit 39.0 - 52.0 % 43.1 40.2 41.0  Platelets 150 - 400 K/uL 199 208 182    CMP Latest Ref Rng & Units 01/03/2021 02/25/2018  Glucose 70 - 99 mg/dL 93 97  BUN 6 - 20 mg/dL 15 14  Creatinine 0.61 - 1.24 mg/dL 1.11 1.05  Sodium 135 - 145 mmol/L 140 137  Potassium 3.5 - 5.1 mmol/L 3.8 3.9  Chloride 98 - 111 mmol/L 103 102  CO2 22 - 32 mmol/L 26 26  Calcium 8.9 - 10.3 mg/dL 9.9 9.2  Total Protein 6.5 - 8.1 g/dL 8.2(H) 7.0  Total Bilirubin 0.3 - 1.2 mg/dL 0.5 0.5  Alkaline Phos 38 - 126 U/L 103 85  AST 15 - 41 U/L 18 18  ALT 0 - 44 U/L 19 15(L)   . Lab Results  Component Value Date   LDH 156 01/03/2021    RADIOGRAPHIC STUDIES: I have personally reviewed the radiological images as listed and agreed with the findings in the report. No results found.   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 -Advised pt that he has some elevated WBC at around 16.5K. This is stable over the last few years and are lymphocytes primarily increased. -  Advised pt that most lymphocytes are polytypic and not suggestive of non-hodgkin's Lymphoma. These are increased due to smoking and genetic mutation.  -Advised pt he does have 1% that are monoclonal B lymphocytes that are a pre-lymphoma that may get to non-hodgkin's lymphoma but might now. They show features that suggest they all  come from one parent cell and are not symptomatic or affecting pt's blood counts. Chance of progression would be 1% each year. -Discussed pt's fatigue and related iron and B12 levels. Advised pt his iron levels were borderline normal and B12 was borderline low. Optimization may help energy levels. -Discussed pt's Hyper IgM syndrome and reduced IgG/IgA--advised pt that tests were not indicative of lymphoma and were due to immunoglobulin class shift at birth. Would not recommend IV IG at this time unless active infection that is not healing nor improving. -Advised pt his fungal infection could be due to Hyper IgM but is most likely due to excess liquids or cleaning solvents. Discuss a different antifungal with doctor. -Will f/u regarding CT exam at [facility]. The pt notes that he prefers they call his wife for scheduling due to his secure job. Arbie Cookey Epperly's number is [phone] and the pt has granted permission to share medical information with her. -Recommended pt decrease smoking and work towards smoking cessaetion. -Recommended pt stay up to date with all age related screenings. -Recommended stress reduction, optimizing sleep, and eta healthy, balanced diet. -Start Vitamin B-Complex once daily. Continue Vitamin B12 shot once monthly. -Advised pt that CT is for baseline and non-emergent at this time. Will get before we see pt back in 6 months. -Will see back in 6 months with labs.   FOLLOW UP: RTC w Dr Irene Limbo with labs in 6 months Please schedule CT Chest/Abd/Pel at [facility] at least 1 week prior to this appointment in 6 months (Call wife Laban Orourke at [phone] for all scheduling)    All of the patients questions were answered with apparent satisfaction. The patient knows to call the clinic with any problems, questions or concerns.   The total time spent in the appointment was 20 minutes and more than 50% was on counseling and direct patient cares.     Sullivan Lone MD [place] AAHIVMS [facility]  [facility] Hematology/Oncology Physician [facility]  (Office):       [phone] (Work cell):  [phone] (Fax):           [phone]  01/17/2021 4:09 PM  I, Reinaldo Raddle, am acting as scribe for Dr. Sullivan Lone, MD.   .I have reviewed the above documentation for accuracy and completeness, and I agree with the above. Brunetta Genera MD

## 2021-01-17 ENCOUNTER — Inpatient Hospital Stay (HOSPITAL_BASED_OUTPATIENT_CLINIC_OR_DEPARTMENT_OTHER): Payer: BC Managed Care – PPO | Admitting: Hematology

## 2021-01-17 DIAGNOSIS — D72820 Lymphocytosis (symptomatic): Secondary | ICD-10-CM | POA: Diagnosis not present

## 2021-01-17 DIAGNOSIS — D805 Immunodeficiency with increased immunoglobulin M [IgM]: Secondary | ICD-10-CM | POA: Diagnosis not present

## 2021-01-18 ENCOUNTER — Telehealth: Payer: Self-pay | Admitting: Hematology

## 2021-01-18 NOTE — Telephone Encounter (Signed)
Scheduled follow-up appointment per 3/29 los. Patient's wife is aware.

## 2021-02-10 ENCOUNTER — Other Ambulatory Visit: Payer: Self-pay | Admitting: Student

## 2021-02-10 DIAGNOSIS — M5136 Other intervertebral disc degeneration, lumbar region: Secondary | ICD-10-CM

## 2021-02-10 DIAGNOSIS — M5126 Other intervertebral disc displacement, lumbar region: Secondary | ICD-10-CM

## 2021-02-15 ENCOUNTER — Other Ambulatory Visit: Payer: Self-pay

## 2021-02-15 ENCOUNTER — Ambulatory Visit
Admission: RE | Admit: 2021-02-15 | Discharge: 2021-02-15 | Disposition: A | Discharge status: Home / Self Care | Payer: BC Managed Care – PPO | Source: Ambulatory Visit | Attending: Student | Admitting: Student

## 2021-02-15 DIAGNOSIS — M5126 Other intervertebral disc displacement, lumbar region: Secondary | ICD-10-CM

## 2021-02-15 DIAGNOSIS — M5136 Other intervertebral disc degeneration, lumbar region: Secondary | ICD-10-CM

## 2021-02-15 MED ORDER — IOPAMIDOL (ISOVUE-M 200) INJECTION 41%
1.0000 mL | Freq: Once | INTRAMUSCULAR | Status: AC
  Administered 2021-02-15: 1 mL via EPIDURAL

## 2021-02-15 MED ORDER — METHYLPREDNISOLONE ACETATE 40 MG/ML INJ SUSP (RADIOLOG
80.0000 mg | Freq: Once | INTRAMUSCULAR | Status: AC
  Administered 2021-02-15: 80 mg via EPIDURAL

## 2021-02-15 NOTE — Discharge Instructions (Signed)

## 2021-07-18 ENCOUNTER — Ambulatory Visit: Payer: BC Managed Care – PPO | Admitting: Hematology

## 2021-07-18 ENCOUNTER — Other Ambulatory Visit: Payer: BC Managed Care – PPO

## 2021-08-09 ENCOUNTER — Inpatient Hospital Stay: Payer: BC Managed Care – PPO

## 2021-08-09 ENCOUNTER — Inpatient Hospital Stay: Payer: BC Managed Care – PPO | Admitting: Hematology

## 2021-08-15 ENCOUNTER — Other Ambulatory Visit: Payer: Self-pay

## 2021-08-30 ENCOUNTER — Ambulatory Visit (HOSPITAL_COMMUNITY): Admission: RE | Admit: 2021-08-30 | Payer: BC Managed Care – PPO | Source: Ambulatory Visit

## 2021-09-01 ENCOUNTER — Encounter (HOSPITAL_COMMUNITY): Payer: Self-pay

## 2021-09-01 ENCOUNTER — Ambulatory Visit (HOSPITAL_COMMUNITY)
Admission: RE | Admit: 2021-09-01 | Discharge: 2021-09-01 | Disposition: A | Discharge status: Home / Self Care | Payer: BC Managed Care – PPO | Source: Ambulatory Visit | Attending: Hematology | Admitting: Hematology

## 2021-09-01 ENCOUNTER — Other Ambulatory Visit: Payer: Self-pay

## 2021-09-01 DIAGNOSIS — D72820 Lymphocytosis (symptomatic): Secondary | ICD-10-CM | POA: Diagnosis not present

## 2021-09-01 LAB — POCT I-STAT CREATININE: Creatinine, Ser: 1.1 mg/dL (ref 0.61–1.24)

## 2021-09-01 MED ORDER — IOHEXOL 350 MG/ML SOLN
80.0000 mL | Freq: Once | INTRAVENOUS | Status: AC | PRN
  Administered 2021-09-01: 80 mL via INTRAVENOUS

## 2021-09-04 ENCOUNTER — Other Ambulatory Visit: Payer: BC Managed Care – PPO

## 2021-09-04 ENCOUNTER — Ambulatory Visit: Payer: BC Managed Care – PPO | Admitting: Hematology

## 2021-09-19 ENCOUNTER — Other Ambulatory Visit: Payer: Self-pay

## 2021-09-19 DIAGNOSIS — D805 Immunodeficiency with increased immunoglobulin M [IgM]: Secondary | ICD-10-CM

## 2021-09-20 ENCOUNTER — Other Ambulatory Visit: Payer: Self-pay

## 2021-09-20 ENCOUNTER — Inpatient Hospital Stay: Payer: BC Managed Care – PPO | Attending: Hematology

## 2021-09-20 ENCOUNTER — Inpatient Hospital Stay: Payer: BC Managed Care – PPO | Admitting: Hematology

## 2021-09-20 VITALS — BP 131/87 | HR 73 | Temp 97.3°F | Resp 20 | Wt 169.2 lb

## 2021-09-20 DIAGNOSIS — F17210 Nicotine dependence, cigarettes, uncomplicated: Secondary | ICD-10-CM | POA: Diagnosis not present

## 2021-09-20 DIAGNOSIS — D72820 Lymphocytosis (symptomatic): Secondary | ICD-10-CM | POA: Diagnosis not present

## 2021-09-20 DIAGNOSIS — C9110 Chronic lymphocytic leukemia of B-cell type not having achieved remission: Secondary | ICD-10-CM | POA: Diagnosis present

## 2021-09-20 DIAGNOSIS — Z809 Family history of malignant neoplasm, unspecified: Secondary | ICD-10-CM | POA: Diagnosis not present

## 2021-09-20 DIAGNOSIS — D805 Immunodeficiency with increased immunoglobulin M [IgM]: Secondary | ICD-10-CM | POA: Diagnosis not present

## 2021-09-20 LAB — CBC WITH DIFFERENTIAL (CANCER CENTER ONLY)
Abs Immature Granulocytes: 0.02 10*3/uL (ref 0.00–0.07)
Basophils Absolute: 0.1 10*3/uL (ref 0.0–0.1)
Basophils Relative: 1 %
Eosinophils Absolute: 0.1 10*3/uL (ref 0.0–0.5)
Eosinophils Relative: 0 %
HCT: 43.9 % (ref 39.0–52.0)
Hemoglobin: 14.8 g/dL (ref 13.0–17.0)
Immature Granulocytes: 0 %
Lymphocytes Relative: 57 %
Lymphs Abs: 7.3 10*3/uL — ABNORMAL HIGH (ref 0.7–4.0)
MCH: 30.2 pg (ref 26.0–34.0)
MCHC: 33.7 g/dL (ref 30.0–36.0)
MCV: 89.6 fL (ref 80.0–100.0)
Monocytes Absolute: 1.4 10*3/uL — ABNORMAL HIGH (ref 0.1–1.0)
Monocytes Relative: 11 %
Neutro Abs: 3.9 10*3/uL (ref 1.7–7.7)
Neutrophils Relative %: 31 %
Platelet Count: 227 10*3/uL (ref 150–400)
RBC: 4.9 MIL/uL (ref 4.22–5.81)
RDW: 13.3 % (ref 11.5–15.5)
Smear Review: NORMAL
WBC Count: 12.8 10*3/uL — ABNORMAL HIGH (ref 4.0–10.5)
nRBC: 0 % (ref 0.0–0.2)

## 2021-09-20 LAB — CMP (CANCER CENTER ONLY)
ALT: 20 U/L (ref 0–44)
AST: 22 U/L (ref 15–41)
Albumin: 4.4 g/dL (ref 3.5–5.0)
Alkaline Phosphatase: 103 U/L (ref 38–126)
Anion gap: 10 (ref 5–15)
BUN: 13 mg/dL (ref 8–23)
CO2: 26 mmol/L (ref 22–32)
Calcium: 9.3 mg/dL (ref 8.9–10.3)
Chloride: 103 mmol/L (ref 98–111)
Creatinine: 1.06 mg/dL (ref 0.61–1.24)
GFR, Estimated: >60 mL/min (ref >60)
Glucose, Bld: 86 mg/dL (ref 70–99)
Potassium: 4.2 mmol/L (ref 3.5–5.1)
Sodium: 139 mmol/L (ref 135–145)
Total Bilirubin: 0.5 mg/dL (ref 0.3–1.2)
Total Protein: 8 g/dL (ref 6.5–8.1)

## 2021-09-20 LAB — SEDIMENTATION RATE: Sed Rate: 32 mm/hr — ABNORMAL HIGH (ref 0–16)

## 2021-09-20 LAB — IRON AND TIBC
Iron: 88 ug/dL (ref 45–182)
Saturation Ratios: 22 % (ref 17.9–39.5)
TIBC: 406 ug/dL (ref 250–450)
UIBC: 318 ug/dL

## 2021-09-20 LAB — FERRITIN: Ferritin: 40 ng/mL (ref 24–336)

## 2021-09-20 LAB — VITAMIN B12: Vitamin B-12: 322 pg/mL (ref 180–914)

## 2021-09-20 LAB — LACTATE DEHYDROGENASE: LDH: 162 U/L (ref 98–192)

## 2021-09-21 ENCOUNTER — Telehealth: Payer: Self-pay | Admitting: Hematology

## 2021-09-21 NOTE — Telephone Encounter (Signed)
Scheduled follow-up appointment per 11/30 los. Patient's wife is aware.

## 2021-09-25 LAB — MULTIPLE MYELOMA PANEL, SERUM
Albumin SerPl Elph-Mcnc: 3.8 g/dL (ref 2.9–4.4)
Albumin/Glob SerPl: 1.2 (ref 0.7–1.7)
Alpha 1: 0.2 g/dL (ref 0.0–0.4)
Alpha2 Glob SerPl Elph-Mcnc: 0.9 g/dL (ref 0.4–1.0)
B-Globulin SerPl Elph-Mcnc: 0.9 g/dL (ref 0.7–1.3)
Gamma Glob SerPl Elph-Mcnc: 1.2 g/dL (ref 0.4–1.8)
Globulin, Total: 3.2 g/dL (ref 2.2–3.9)
IgA: 73 mg/dL (ref 61–437)
IgG (Immunoglobin G), Serum: 455 mg/dL — ABNORMAL LOW (ref 603–1613)
IgM (Immunoglobulin M), Srm: 1386 mg/dL — ABNORMAL HIGH (ref 20–172)
Total Protein ELP: 7 g/dL (ref 6.0–8.5)

## 2021-09-26 NOTE — Progress Notes (Addendum)
HEMATOLOGY/ONCOLOGY CLINIC NOTE  Date of Service: 09/27/2021  Patient Care Team: Medicine, Ledell Noss Internal as PCP - General (Internal Medicine)  CHIEF COMPLAINTS/PURPOSE OF CONSULTATION:  Follow-up for hyper IgM Syndrome Follow-up for lymphoproliferative disorder ?  Monoclonal B lymphocytosis review of CT scan.  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 He notes no acute new symptoms since his last clinic visit. No fevers no chills no night sweats no unexpected weight loss. He notes no new fatigue and has been staying quite physically active. No shortness of breath or chest pain.  No abdominal pain or distention.  Labs done today 09/20/2021 show WBC count is 12.8k  (down from 16.6k) with 7.3k lymphocytes and normal hemoglobin of 14.8 and normal platelets of 227k   CMP unremarkable Iron labs within normal limits B12 322 LDH 162 Myeloma panel shows no M protein spike IFE with polyclonal increase in IgM levels  Discussed CT chest abdomen pelvis which was done on 09/01/2021 and showed no lymphadenopathy in the chest abdomen or pelvis, normal volume spleen and no evidence of malignancy.  We discussed that findings are consistent with monoclonal B lymphocytosis without any lymphadenopathy or hepatosplenomegaly.  MEDICAL HISTORY:  Past Medical History:  Diagnosis Date   Hyper IgM syndrome (HCC)    WBC disorder   Non Hodgkin's lymphoma ([place]) 2010    SURGICAL HISTORY: Past Surgical History:  Procedure Laterality Date   CHOLECYSTECTOMY     COLONOSCOPY WITH PROPOFOL N/A 07/06/2020   Procedure: COLONOSCOPY WITH PROPOFOL;  Surgeon: Rogene Houston, MD;  [facility];  Service: Endoscopy;  Laterality: N/A;  125   GASTRIC BYPASS     HERNIA REPAIR     POLYPECTOMY  07/06/2020   Procedure: POLYPECTOMY;  Surgeon:  Rogene Houston, MD;  [facility];  Service: Endoscopy;;   TESTICLE TORSION REDUCTION      SOCIAL HISTORY: Social History   Socioeconomic History   Marital status: Married    Spouse name: Not on file   Number of children: Not on file   Years of education: Not on file   Highest education level: Not on file  Occupational History   Not on file  Tobacco Use   Smoking status: Every Day    Packs/day: 0.50    Years: 44.00    Pack years: 22.00    Types: Cigarettes   Smokeless tobacco: Never  Vaping Use   Vaping Use: Never used  Substance and Sexual Activity   Alcohol use: Yes    Alcohol/week: 6.0 standard drinks    Types: 6 Cans of beer per week   Drug use: Never   Sexual activity: Not on file  Other Topics Concern   Not on file  Social History Narrative   Not on file   Social Determinants of Health   Financial Resource Strain: Not on file  Food Insecurity: Not on file  Transportation Needs: Not on file  Physical Activity: Not on file  Stress: Not on file  Social Connections: Not on file  Intimate Partner Violence: Not on file    FAMILY HISTORY: Family History  Problem Relation Age of Onset   Heart attack Mother    Heart attack Father    Alzheimer's disease Maternal Grandmother    Cancer Maternal Grandfather    Cancer Paternal Grandfather  ALLERGIES:  is allergic to terbinafine and terbinafine and related.  MEDICATIONS:  Current Outpatient Medications  Medication Sig Dispense Refill   acetaminophen (TYLENOL) 500 MG tablet Take 500 mg by mouth every 6 (six) hours as needed for moderate pain or headache.      cyanocobalamin (,VITAMIN B-12,) 1000 MCG/ML injection Inject 1,000 mcg into the muscle every 30 (thirty) days.      loratadine (CLARITIN) 10 MG tablet Take by mouth.     losartan (COZAAR) 50 MG tablet      Multiple Vitamin (MULTIVITAMINS PO)      lisinopril (ZESTRIL) 5 MG tablet Take by mouth. (Patient not taking: Reported on 09/20/2021)      OIL OF OREGANO PO Take by mouth. (Patient not taking: Reported on 09/20/2021)     No current facility-administered medications for this visit.    REVIEW OF SYSTEMS:   .10 Point review of Systems was done is negative except as noted above.  PHYSICAL EXAMINATION: ECOG PERFORMANCE STATUS: 1 - Symptomatic but completely ambulatory  . Vitals:   09/20/21 0900  BP: 131/87  Pulse: 73  Resp: 20  Temp: (!) 97.3 F (36.3 C)  SpO2: 98%   Filed Weights   09/20/21 0900  Weight: 169 lb 3.2 oz (76.7 kg)   .Body mass index is 22.32 kg/m.  Marland Kitchen GENERAL:alert, in no acute distress and comfortable SKIN: no acute rashes, no significant lesions EYES: conjunctiva are pink and non-injected, sclera anicteric OROPHARYNX: MMM, no exudates, no oropharyngeal erythema or ulceration NECK: supple, no JVD LYMPH:  no palpable lymphadenopathy in the cervical, axillary or inguinal regions LUNGS: clear to auscultation b/l with normal respiratory effort HEART: regular rate & rhythm ABDOMEN:  normoactive bowel sounds , non tender, not distended. Extremity: no pedal edema PSYCH: alert & oriented x 3 with fluent speech NEURO: no focal motor/sensory deficits   LABORATORY DATA:  I have reviewed the data as listed  CBC Latest Ref Rng & Units 09/20/2021 01/03/2021 07/04/2020  WBC 4.0 - 10.5 K/uL 12.8(H) 16.6(H) 13.2(H)  Hemoglobin 13.0 - 17.0 g/dL 14.8 14.3 13.1  Hematocrit 39.0 - 52.0 % 43.9 43.1 40.2  Platelets 150 - 400 K/uL 227 199 208    CMP Latest Ref Rng & Units 09/20/2021 09/01/2021 01/03/2021  Glucose 70 - 99 mg/dL 86 - 93  BUN 8 - 23 mg/dL 13 - 15  Creatinine 0.61 - 1.24 mg/dL 1.06 1.10 1.11  Sodium 135 - 145 mmol/L 139 - 140  Potassium 3.5 - 5.1 mmol/L 4.2 - 3.8  Chloride 98 - 111 mmol/L 103 - 103  CO2 22 - 32 mmol/L 26 - 26  Calcium 8.9 - 10.3 mg/dL 9.3 - 9.9  Total Protein 6.5 - 8.1 g/dL 8.0 - 8.2(H)  Total Bilirubin 0.3 - 1.2 mg/dL 0.5 - 0.5  Alkaline Phos 38 - 126 U/L 103 - 103  AST  15 - 41 U/L 22 - 18  ALT 0 - 44 U/L 20 - 19   . Lab Results  Component Value Date   LDH 162 09/20/2021    RADIOGRAPHIC STUDIES: I have personally reviewed the radiological images as listed and agreed with the findings in the report. CT CHEST ABDOMEN PELVIS W CONTRAST  Result Date: 09/01/2021 CLINICAL DATA:  Weight loss. Concern for chronic lymphocytic leukemia. EXAM: CT CHEST, ABDOMEN, AND PELVIS WITH CONTRAST TECHNIQUE: Multidetector CT imaging of the chest, abdomen and pelvis was performed following the standard protocol during bolus administration of intravenous contrast. CONTRAST:  15mL OMNIPAQUE IOHEXOL  350 MG/ML SOLN COMPARISON:  CT 11/20/2016 FINDINGS: CT CHEST FINDINGS Cardiovascular: Coronary artery calcification and aortic atherosclerotic calcification. Mediastinum/Nodes: No axillary or supraclavicular adenopathy. No mediastinal or hilar adenopathy. No pericardial fluid. Esophagus normal. Lungs/Pleura: Centrilobular emphysema of the upper lobes. No suspicious nodularity. No airspace disease. Musculoskeletal: No aggressive osseous lesion. CT ABDOMEN AND PELVIS FINDINGS Hepatobiliary: No focal hepatic lesion. No biliary ductal dilatation. Gallbladder is normal. Common bile duct is normal. Pancreas: Pancreas is normal. No ductal dilatation. No pancreatic inflammation. Spleen: Normal spleen Adrenals/urinary tract: Adrenal glands and kidneys are normal. The ureters and bladder normal. Stomach/Bowel: Stomach, small bowel, appendix, and cecum are normal. The colon and rectosigmoid colon are normal. Vascular/Lymphatic: Abdominal aorta is normal caliber with atherosclerotic calcification. There is no retroperitoneal or periportal lymphadenopathy. No pelvic lymphadenopathy. Reproductive: Prostate unremarkable Other: No free fluid. Musculoskeletal: No aggressive osseous lesion. IMPRESSION: Chest Impression: 1. No thoracic lymphadenopathy. 2. Coronary artery calcification and Aortic Atherosclerosis  (ICD10-I70.0). Abdomen / Pelvis Impression: 1. No adenopathy in the abdomen pelvis. 2. Normal volume spleen. 3. No evidence of malignancy. Electronically Signed   By: Suzy Bouchard M.D.   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  We discussed that findings are consistent with monoclonal B lymphocytosis without any lymphadenopathy or hepatosplenomegaly. -No indication of progression of the patient's monoclonal B lymphocytosis to CLL.  No indication for additional work-up or treatment of this condition currently. -Discussed infection precautions in the setting of his hyper IgM syndrome and discussed importance of following with primary care physician to stay up to speed with age-appropriate vaccinations. -Continue follow-up with primary care physician for age-appropriate cancer screening. -Continue Vitamin B-Complex once daily. Continue Vitamin B12 shot once monthly.  FOLLOW UP: RTC with Dr Irene Limbo with labs in 12 months (Call wife Lamonte Hartt at [phone] for  all scheduling)  All of the patients questions were answered with apparent satisfaction. The patient knows to call the clinic with any problems, questions or concerns.   Sullivan Lone MD [place] AAHIVMS [facility] Hematology/Oncology Physician [facility]

## 2021-11-29 ENCOUNTER — Other Ambulatory Visit: Payer: Self-pay | Admitting: Internal Medicine

## 2021-11-29 ENCOUNTER — Other Ambulatory Visit: Payer: Self-pay | Admitting: Student

## 2021-11-29 DIAGNOSIS — M545 Low back pain, unspecified: Secondary | ICD-10-CM

## 2021-11-29 DIAGNOSIS — G8929 Other chronic pain: Secondary | ICD-10-CM

## 2021-11-30 ENCOUNTER — Other Ambulatory Visit: Payer: Self-pay

## 2021-11-30 ENCOUNTER — Ambulatory Visit
Admission: RE | Admit: 2021-11-30 | Discharge: 2021-11-30 | Disposition: A | Discharge status: Home / Self Care | Payer: BC Managed Care – PPO | Source: Ambulatory Visit | Attending: Internal Medicine | Admitting: Internal Medicine

## 2021-11-30 DIAGNOSIS — M545 Low back pain, unspecified: Secondary | ICD-10-CM

## 2021-11-30 DIAGNOSIS — G8929 Other chronic pain: Secondary | ICD-10-CM

## 2021-11-30 MED ORDER — METHYLPREDNISOLONE ACETATE 40 MG/ML INJ SUSP (RADIOLOG: 80.0000 mg | Freq: Once | INTRAMUSCULAR | Status: DC

## 2021-11-30 MED ORDER — IOPAMIDOL (ISOVUE-M 200) INJECTION 41%: 1.0000 mL | Freq: Once | INTRAMUSCULAR | Status: DC

## 2021-11-30 NOTE — Discharge Instructions (Signed)

## 2021-12-07 ENCOUNTER — Other Ambulatory Visit: Payer: Self-pay | Admitting: Student

## 2021-12-07 DIAGNOSIS — M545 Low back pain, unspecified: Secondary | ICD-10-CM

## 2021-12-07 DIAGNOSIS — G8929 Other chronic pain: Secondary | ICD-10-CM

## 2021-12-15 ENCOUNTER — Other Ambulatory Visit: Payer: Self-pay

## 2021-12-15 ENCOUNTER — Ambulatory Visit
Admission: RE | Admit: 2021-12-15 | Discharge: 2021-12-15 | Disposition: A | Discharge status: Home / Self Care | Payer: BC Managed Care – PPO | Source: Ambulatory Visit | Attending: Student | Admitting: Student

## 2021-12-15 DIAGNOSIS — M545 Low back pain, unspecified: Secondary | ICD-10-CM

## 2021-12-15 DIAGNOSIS — G8929 Other chronic pain: Secondary | ICD-10-CM

## 2021-12-15 MED ORDER — METHYLPREDNISOLONE ACETATE 40 MG/ML INJ SUSP (RADIOLOG
80.0000 mg | Freq: Once | INTRAMUSCULAR | Status: AC
  Administered 2021-12-15: 80 mg via EPIDURAL

## 2021-12-15 MED ORDER — IOPAMIDOL (ISOVUE-M 200) INJECTION 41%
10.0000 mL | Freq: Once | INTRAMUSCULAR | Status: AC
  Administered 2021-12-15: 10 mL via EPIDURAL

## 2021-12-15 NOTE — Discharge Instructions (Signed)

## 2022-05-01 ENCOUNTER — Telehealth: Payer: Self-pay | Admitting: Hematology

## 2022-05-01 NOTE — Telephone Encounter (Signed)
Rescheduled upcoming appointment due to provider's template. Patient's wife is aware of changes.

## 2022-09-18 ENCOUNTER — Other Ambulatory Visit: Payer: Self-pay

## 2022-09-18 DIAGNOSIS — D805 Immunodeficiency with increased immunoglobulin M [IgM]: Secondary | ICD-10-CM

## 2022-09-19 ENCOUNTER — Inpatient Hospital Stay: Payer: BC Managed Care – PPO | Attending: Hematology

## 2022-09-19 ENCOUNTER — Inpatient Hospital Stay: Payer: BC Managed Care – PPO | Admitting: Hematology

## 2022-09-19 ENCOUNTER — Other Ambulatory Visit: Payer: Self-pay

## 2022-09-19 VITALS — BP 135/91 | HR 78 | Temp 98.1°F | Resp 18 | Ht 73.0 in | Wt 167.8 lb

## 2022-09-19 DIAGNOSIS — D805 Immunodeficiency with increased immunoglobulin M [IgM]: Secondary | ICD-10-CM

## 2022-09-19 DIAGNOSIS — F1090 Alcohol use, unspecified, uncomplicated: Secondary | ICD-10-CM | POA: Diagnosis not present

## 2022-09-19 DIAGNOSIS — F17210 Nicotine dependence, cigarettes, uncomplicated: Secondary | ICD-10-CM | POA: Diagnosis not present

## 2022-09-19 DIAGNOSIS — Z809 Family history of malignant neoplasm, unspecified: Secondary | ICD-10-CM | POA: Insufficient documentation

## 2022-09-19 DIAGNOSIS — D72820 Lymphocytosis (symptomatic): Secondary | ICD-10-CM

## 2022-09-19 LAB — CBC WITH DIFFERENTIAL (CANCER CENTER ONLY)
Abs Immature Granulocytes: 0.03 10*3/uL (ref 0.00–0.07)
Basophils Absolute: 0 10*3/uL (ref 0.0–0.1)
Basophils Relative: 0 %
Eosinophils Absolute: 0.1 10*3/uL (ref 0.0–0.5)
Eosinophils Relative: 0 %
HCT: 43 % (ref 39.0–52.0)
Hemoglobin: 14.6 g/dL (ref 13.0–17.0)
Immature Granulocytes: 0 %
Lymphocytes Relative: 62 %
Lymphs Abs: 9.4 10*3/uL — ABNORMAL HIGH (ref 0.7–4.0)
MCH: 31.4 pg (ref 26.0–34.0)
MCHC: 34 g/dL (ref 30.0–36.0)
MCV: 92.5 fL (ref 80.0–100.0)
Monocytes Absolute: 1.5 10*3/uL — ABNORMAL HIGH (ref 0.1–1.0)
Monocytes Relative: 10 %
Neutro Abs: 4.3 10*3/uL (ref 1.7–7.7)
Neutrophils Relative %: 28 %
Platelet Count: 224 10*3/uL (ref 150–400)
RBC: 4.65 MIL/uL (ref 4.22–5.81)
RDW: 13 % (ref 11.5–15.5)
Smear Review: NORMAL
WBC Count: 15.3 10*3/uL — ABNORMAL HIGH (ref 4.0–10.5)
nRBC: 0 % (ref 0.0–0.2)

## 2022-09-19 LAB — CMP (CANCER CENTER ONLY)
ALT: 18 U/L (ref 0–44)
AST: 17 U/L (ref 15–41)
Albumin: 4.8 g/dL (ref 3.5–5.0)
Alkaline Phosphatase: 98 U/L (ref 38–126)
Anion gap: 8 (ref 5–15)
BUN: 19 mg/dL (ref 8–23)
CO2: 26 mmol/L (ref 22–32)
Calcium: 10.1 mg/dL (ref 8.9–10.3)
Chloride: 103 mmol/L (ref 98–111)
Creatinine: 1.13 mg/dL (ref 0.61–1.24)
GFR, Estimated: >60 mL/min (ref >60)
Glucose, Bld: 102 mg/dL — ABNORMAL HIGH (ref 70–99)
Potassium: 4 mmol/L (ref 3.5–5.1)
Sodium: 137 mmol/L (ref 135–145)
Total Bilirubin: 0.4 mg/dL (ref 0.3–1.2)
Total Protein: 8.1 g/dL (ref 6.5–8.1)

## 2022-09-19 LAB — IRON AND IRON BINDING CAPACITY (CC-WL,HP ONLY)
Iron: 68 ug/dL (ref 45–182)
Saturation Ratios: 16 % — ABNORMAL LOW (ref 17.9–39.5)
TIBC: 435 ug/dL (ref 250–450)
UIBC: 367 ug/dL (ref 117–376)

## 2022-09-19 LAB — VITAMIN B12: Vitamin B-12: 457 pg/mL (ref 180–914)

## 2022-09-19 LAB — FERRITIN: Ferritin: 25 ng/mL (ref 24–336)

## 2022-09-19 LAB — LACTATE DEHYDROGENASE: LDH: 147 U/L (ref 98–192)

## 2022-09-19 NOTE — Progress Notes (Signed)
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  Monoclonal B lymphocytosis review of CT scan.  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 Patient was last seen by me on 09/20/2021 and was doing well overall without new medical concerns.   Patient reports he has been doing well without any new medical concerns since our last visit. He is complaint with his medication. He started receiving B-12 shots. He denies abdominal pain, loss of appetite, new infection, or leg swelling. Patient does complain of weight loss due to being busy at work.   He reports he has bulging disk and complains of occasional back pain.   Patient notes he previously had hernia surgery and reports of some mild pain at the site.   He reports that he smokes about half pack a day.  He has received his influenza vaccine and COVID-19 Booster, but not the RSV vaccine.   MEDICAL HISTORY:  Past Medical History:  Diagnosis Date   Hyper IgM syndrome (HCC)    WBC disorder   Non Hodgkin's lymphoma ([place]) 2010    SURGICAL HISTORY: Past Surgical History:  Procedure Laterality Date   CHOLECYSTECTOMY     COLONOSCOPY WITH PROPOFOL N/A 07/06/2020   Procedure: COLONOSCOPY WITH PROPOFOL;  Surgeon: Rogene Houston, MD;  [facility];  Service: Endoscopy;  Laterality: N/A;  125   GASTRIC BYPASS     HERNIA REPAIR     POLYPECTOMY  07/06/2020   Procedure: POLYPECTOMY;  Surgeon: Rogene Houston, MD;  [facility];  Service: Endoscopy;;   TESTICLE TORSION REDUCTION      SOCIAL HISTORY: Social History   Socioeconomic History    Marital status: Married    Spouse name: Not on file   Number of children: Not on file   Years of education: Not on file   Highest education level: Not on file  Occupational History   Not on file  Tobacco Use   Smoking status: Every Day    Packs/day: 0.50    Years: 44.00    Total pack years: 22.00    Types: Cigarettes   Smokeless tobacco: Never  Vaping Use   Vaping Use: Never used  Substance and Sexual Activity   Alcohol use: Yes    Alcohol/week: 6.0 standard drinks of alcohol    Types: 6 Cans of beer per week   Drug use: Never   Sexual activity: Not on file  Other Topics Concern   Not on file  Social History Narrative   Not on file   Social Determinants of Health   Financial Resource Strain: Not on file  Food Insecurity: Not on file  Transportation Needs: Not on file  Physical Activity: Not on file  Stress: Not on file  Social Connections: Not on file  Intimate Partner Violence: Not on file    FAMILY HISTORY: Family History  Problem Relation Age of Onset   Heart attack Mother    Heart attack Father    Alzheimer's disease Maternal Grandmother    Cancer Maternal Grandfather    Cancer Paternal Grandfather     ALLERGIES:  is allergic to terbinafine and terbinafine and related.  MEDICATIONS:  Current Outpatient Medications  Medication Sig Dispense Refill   acetaminophen (TYLENOL) 500 MG tablet Take 500 mg by mouth every 6 (six) hours as needed for moderate pain or headache.      cyanocobalamin (,VITAMIN B-12,) 1000 MCG/ML injection Inject 1,000 mcg into the muscle every 30 (thirty) days.      lisinopril (ZESTRIL) 5 MG tablet Take by mouth. (Patient not taking: Reported on 09/20/2021)     loratadine (CLARITIN) 10 MG tablet Take by mouth.     losartan (COZAAR) 50 MG tablet      Multiple Vitamin (MULTIVITAMINS PO)      OIL OF OREGANO PO Take by mouth. (Patient not taking: Reported on 09/20/2021)     No current facility-administered medications for this visit.     REVIEW OF SYSTEMS:   .10 Point review of Systems was done is negative except as noted above.  PHYSICAL EXAMINATION: ECOG PERFORMANCE STATUS: 1 - Symptomatic but completely ambulatory  . Vitals:   09/19/22 1405  BP: (!) 135/91  Pulse: 78  Resp: 18  Temp: 98.1 F (36.7 C)  SpO2: 98%    Filed Weights   09/19/22 1405  Weight: 167 lb 12.8 oz (76.1 kg)    .Body mass index is 22.14 kg/m.  Marland Kitchen GENERAL:alert, in no acute distress and comfortable SKIN: no acute rashes, no significant lesions EYES: conjunctiva are pink and non-injected, sclera anicteric OROPHARYNX: MMM, no exudates, no oropharyngeal erythema or ulceration NECK: supple, no JVD LYMPH:  no palpable lymphadenopathy in the cervical, axillary or inguinal regions LUNGS: clear to auscultation b/l with normal respiratory effort HEART: regular rate & rhythm ABDOMEN:  normoactive bowel sounds , non tender, not distended. Extremity: no pedal edema PSYCH: alert & oriented x 3 with fluent speech NEURO: no focal motor/sensory deficits   LABORATORY DATA:  I have reviewed the data as listed     Latest Ref Rng & Units 09/20/2021    8:20 AM 01/03/2021    3:01 PM 07/04/2020    1:23 PM  CBC  WBC 4.0 - 10.5 K/uL 12.8  16.6  13.2   Hemoglobin 13.0 - 17.0 g/dL 14.8  14.3  13.1   Hematocrit 39.0 - 52.0 % 43.9  43.1  40.2   Platelets 150 - 400 K/uL 227  199  208        Latest Ref Rng & Units 09/20/2021    8:20 AM 09/01/2021    7:50 AM 01/03/2021    3:01 PM  CMP  Glucose 70 - 99 mg/dL 86   93   BUN 8 - 23 mg/dL 13   15   Creatinine 0.61 - 1.24 mg/dL 1.06  1.10  1.11   Sodium 135 - 145 mmol/L 139   140   Potassium 3.5 - 5.1 mmol/L 4.2   3.8   Chloride 98 - 111 mmol/L 103   103   CO2 22 - 32 mmol/L 26   26   Calcium 8.9 - 10.3 mg/dL 9.3   9.9   Total Protein 6.5 - 8.1 g/dL 8.0   8.2   Total Bilirubin 0.3 - 1.2 mg/dL 0.5   0.5   Alkaline Phos 38 - 126 U/L 103   103   AST 15 - 41 U/L 22   18   ALT 0 - 44 U/L 20   19     . Lab Results  Component Value Date   LDH 162 09/20/2021    RADIOGRAPHIC STUDIES: I have personally reviewed  the radiological images as listed and agreed with the findings in the report. No results found.   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 Labs shows WBC of 15.3 K, hemoglobin of 14.6 K, and platelets of 224 K.CMP stable.  -No indication of progression of the patient's monoclonal B lymphocytosis to CLL.  No indication for additional work-up or treatment of this condition currently. -Continue follow-up with primary care physician for age-appropriate cancer screening. -Continue Vitamin B-Complex once daily. Continue Vitamin B12 shot once monthly.  FOLLOW UP: RTC with Dr Irene Limbo with labs in 12 months (Call wife Daegon Deiss at [phone] for all scheduling)   The total time spent in the appointment was 20 minutes* .  All of the patient's questions were answered with apparent satisfaction. The patient knows to call the clinic with any problems, questions or concerns.   Sullivan Lone MD MS AAHIVMS [facility] Hematology/Oncology Physician [facility]  .*Total Encounter Time as defined by the Centers for Medicare and Medicaid Services includes, in addition to the face-to-face time of a patient visit (documented in the note above) non-face-to-face time: obtaining and reviewing outside history, ordering and reviewing medications, tests or procedures, care coordination (communications with other health  care professionals or caregivers) and documentation in the medical record.   I, Param Shah,am acting as a scribe for Sullivan Lone, MD.  .I have reviewed the above documentation for accuracy and completeness, and I agree with the above. Brunetta Genera MD

## 2022-09-20 ENCOUNTER — Ambulatory Visit: Payer: BC Managed Care – PPO | Admitting: Hematology

## 2022-09-20 ENCOUNTER — Other Ambulatory Visit: Payer: BC Managed Care – PPO

## 2022-09-24 LAB — MULTIPLE MYELOMA PANEL, SERUM
Albumin SerPl Elph-Mcnc: 4.2 g/dL (ref 2.9–4.4)
Albumin/Glob SerPl: 1.3 (ref 0.7–1.7)
Alpha 1: 0.2 g/dL (ref 0.0–0.4)
Alpha2 Glob SerPl Elph-Mcnc: 0.9 g/dL (ref 0.4–1.0)
B-Globulin SerPl Elph-Mcnc: 0.9 g/dL (ref 0.7–1.3)
Gamma Glob SerPl Elph-Mcnc: 1.3 g/dL (ref 0.4–1.8)
Globulin, Total: 3.3 g/dL (ref 2.2–3.9)
IgA: 67 mg/dL (ref 61–437)
IgG (Immunoglobin G), Serum: 416 mg/dL — ABNORMAL LOW (ref 603–1613)
IgM (Immunoglobulin M), Srm: 1268 mg/dL — ABNORMAL HIGH (ref 20–172)
Total Protein ELP: 7.5 g/dL (ref 6.0–8.5)

## 2022-11-28 ENCOUNTER — Encounter: Payer: Self-pay | Admitting: Student

## 2022-11-30 ENCOUNTER — Other Ambulatory Visit: Payer: Self-pay | Admitting: Student

## 2022-11-30 DIAGNOSIS — M545 Low back pain, unspecified: Secondary | ICD-10-CM

## 2022-12-07 ENCOUNTER — Ambulatory Visit
Admission: RE | Admit: 2022-12-07 | Discharge: 2022-12-07 | Disposition: A | Discharge status: Home / Self Care | Payer: BC Managed Care – PPO | Source: Ambulatory Visit | Attending: Student | Admitting: Student

## 2022-12-07 DIAGNOSIS — M545 Low back pain, unspecified: Secondary | ICD-10-CM

## 2022-12-07 MED ORDER — METHYLPREDNISOLONE ACETATE 40 MG/ML INJ SUSP (RADIOLOG
80.0000 mg | Freq: Once | INTRAMUSCULAR | Status: AC
  Administered 2022-12-07: 80 mg via EPIDURAL

## 2022-12-07 MED ORDER — IOPAMIDOL (ISOVUE-M 200) INJECTION 41%
1.0000 mL | Freq: Once | INTRAMUSCULAR | Status: AC
  Administered 2022-12-07: 1 mL via EPIDURAL

## 2022-12-07 NOTE — Discharge Instructions (Signed)

## 2023-02-06 ENCOUNTER — Other Ambulatory Visit: Payer: Self-pay | Admitting: Student

## 2023-02-06 DIAGNOSIS — M549 Dorsalgia, unspecified: Secondary | ICD-10-CM

## 2023-02-14 ENCOUNTER — Other Ambulatory Visit: Payer: Self-pay | Admitting: Student

## 2023-02-14 DIAGNOSIS — M47816 Spondylosis without myelopathy or radiculopathy, lumbar region: Secondary | ICD-10-CM

## 2023-02-14 DIAGNOSIS — M549 Dorsalgia, unspecified: Secondary | ICD-10-CM

## 2023-02-15 ENCOUNTER — Ambulatory Visit
Admission: RE | Admit: 2023-02-15 | Discharge: 2023-02-15 | Disposition: A | Discharge status: Home / Self Care | Payer: BC Managed Care – PPO | Source: Ambulatory Visit | Attending: Student | Admitting: Student

## 2023-02-15 DIAGNOSIS — M549 Dorsalgia, unspecified: Secondary | ICD-10-CM

## 2023-02-15 DIAGNOSIS — M47816 Spondylosis without myelopathy or radiculopathy, lumbar region: Secondary | ICD-10-CM

## 2023-02-15 MED ORDER — METHYLPREDNISOLONE ACETATE 40 MG/ML INJ SUSP (RADIOLOG
80.0000 mg | Freq: Once | INTRAMUSCULAR | Status: AC
  Administered 2023-02-15: 80 mg via EPIDURAL

## 2023-02-15 MED ORDER — IOPAMIDOL (ISOVUE-M 200) INJECTION 41%
1.0000 mL | Freq: Once | INTRAMUSCULAR | Status: AC
  Administered 2023-02-15: 1 mL via EPIDURAL

## 2023-02-15 NOTE — Discharge Instructions (Signed)

## 2023-09-17 ENCOUNTER — Other Ambulatory Visit: Payer: BC Managed Care – PPO

## 2023-09-17 ENCOUNTER — Ambulatory Visit: Payer: BC Managed Care – PPO | Admitting: Hematology

## 2023-09-27 ENCOUNTER — Other Ambulatory Visit: Payer: Self-pay

## 2023-09-27 DIAGNOSIS — D805 Immunodeficiency with increased immunoglobulin M [IgM]: Secondary | ICD-10-CM

## 2023-09-27 DIAGNOSIS — D72820 Lymphocytosis (symptomatic): Secondary | ICD-10-CM

## 2023-09-30 ENCOUNTER — Inpatient Hospital Stay: Payer: BC Managed Care – PPO | Attending: Hematology

## 2023-09-30 ENCOUNTER — Inpatient Hospital Stay (HOSPITAL_BASED_OUTPATIENT_CLINIC_OR_DEPARTMENT_OTHER): Payer: BC Managed Care – PPO | Admitting: Hematology

## 2023-09-30 VITALS — BP 157/89 | HR 92 | Temp 98.1°F | Resp 20 | Wt 160.7 lb

## 2023-09-30 DIAGNOSIS — Z809 Family history of malignant neoplasm, unspecified: Secondary | ICD-10-CM

## 2023-09-30 DIAGNOSIS — C9110 Chronic lymphocytic leukemia of B-cell type not having achieved remission: Secondary | ICD-10-CM | POA: Diagnosis present

## 2023-09-30 DIAGNOSIS — D805 Immunodeficiency with increased immunoglobulin M [IgM]: Secondary | ICD-10-CM

## 2023-09-30 DIAGNOSIS — D72820 Lymphocytosis (symptomatic): Secondary | ICD-10-CM

## 2023-09-30 DIAGNOSIS — F17210 Nicotine dependence, cigarettes, uncomplicated: Secondary | ICD-10-CM

## 2023-09-30 LAB — CMP (CANCER CENTER ONLY)
ALT: 15 U/L (ref 0–44)
AST: 18 U/L (ref 15–41)
Albumin: 4.3 g/dL (ref 3.5–5.0)
Alkaline Phosphatase: 93 U/L (ref 38–126)
Anion gap: 7 (ref 5–15)
BUN: 16 mg/dL (ref 8–23)
CO2: 27 mmol/L (ref 22–32)
Calcium: 9.9 mg/dL (ref 8.9–10.3)
Chloride: 104 mmol/L (ref 98–111)
Creatinine: 1.16 mg/dL (ref 0.61–1.24)
GFR, Estimated: >60 mL/min (ref >60)
Glucose, Bld: 93 mg/dL (ref 70–99)
Potassium: 3.9 mmol/L (ref 3.5–5.1)
Sodium: 138 mmol/L (ref 135–145)
Total Bilirubin: 0.2 mg/dL (ref <1.2)
Total Protein: 7.3 g/dL (ref 6.5–8.1)

## 2023-09-30 LAB — CBC WITH DIFFERENTIAL (CANCER CENTER ONLY)
Abs Immature Granulocytes: 0.03 10*3/uL (ref 0.00–0.07)
Basophils Absolute: 0 10*3/uL (ref 0.0–0.1)
Basophils Relative: 0 %
Eosinophils Absolute: 0.1 10*3/uL (ref 0.0–0.5)
Eosinophils Relative: 0 %
HCT: 39.4 % (ref 39.0–52.0)
Hemoglobin: 13.2 g/dL (ref 13.0–17.0)
Immature Granulocytes: 0 %
Lymphocytes Relative: 54 %
Lymphs Abs: 8.5 10*3/uL — ABNORMAL HIGH (ref 0.7–4.0)
MCH: 31 pg (ref 26.0–34.0)
MCHC: 33.5 g/dL (ref 30.0–36.0)
MCV: 92.5 fL (ref 80.0–100.0)
Monocytes Absolute: 1.7 10*3/uL — ABNORMAL HIGH (ref 0.1–1.0)
Monocytes Relative: 11 %
Neutro Abs: 5.5 10*3/uL (ref 1.7–7.7)
Neutrophils Relative %: 35 %
Platelet Count: 209 10*3/uL (ref 150–400)
RBC: 4.26 MIL/uL (ref 4.22–5.81)
RDW: 13.2 % (ref 11.5–15.5)
Smear Review: NORMAL
WBC Count: 15.8 10*3/uL — ABNORMAL HIGH (ref 4.0–10.5)
WBC Morphology: ABNORMAL
nRBC: 0 % (ref 0.0–0.2)

## 2023-09-30 LAB — IRON AND IRON BINDING CAPACITY (CC-WL,HP ONLY)
Iron: 36 ug/dL — ABNORMAL LOW (ref 45–182)
Saturation Ratios: 10 % — ABNORMAL LOW (ref 17.9–39.5)
TIBC: 365 ug/dL (ref 250–450)
UIBC: 329 ug/dL (ref 117–376)

## 2023-09-30 LAB — LACTATE DEHYDROGENASE: LDH: 157 U/L (ref 98–192)

## 2023-09-30 LAB — VITAMIN B12: Vitamin B-12: 307 pg/mL (ref 180–914)

## 2023-09-30 LAB — FERRITIN: Ferritin: 30 ng/mL (ref 24–336)

## 2023-09-30 NOTE — Progress Notes (Signed)
HEMATOLOGY/ONCOLOGY CLINIC NOTE  Date of Service: 09/30/2023  Patient Care Team: Medicine, Jonita Albee Internal as PCP - General (Internal Medicine)  CHIEF COMPLAINTS/PURPOSE OF CONSULTATION:  Follow-up for hyper IgM Syndrome Follow-up for lymphoproliferative disorder ?  Monoclonal B lymphocytosis review of CT scan.  HISTORY OF PRESENTING ILLNESS:  Please see previous note for details of HPI  INTERVAL HISTORY  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   Patient was last seen by me on 09/13/2022 and he complained of mild weight loss and occasional back pain.   Patient notes he has been doing well overall since our last visit. He complains of lethargy/fatigue, insomnia, and overall body weakness. Patient notes his fatigue and body weakness does not interfere with his daily activities.   Patient notes he has been having difficulty falling asleep and staying asleep. Patient attributes his sleep difficulties to his back pain.   Patient complains of mild occasional night sweats. He denies any new infection issues, fever, chills, abdominal pain, chest pain, or leg swelling. He denies any abnormal bleeding, black stool, or blood in stool.   He endorsed right hand numbness, where he lost sensation on his hand, around 1-2 weeks ago. Patient notes that this episode lasted couple of minutes.   He takes around 3-4 ibuprofen for the past 4-6 months for his joint pain.   Patient's last colonoscopy was around 1 year ago.   He complains of weight loss since our last visit, but denies appetite loss.   Patient has cut-down his smoking since last visit.    MEDICAL HISTORY:  Past Medical History:  Diagnosis Date   Hyper IgM syndrome (HCC)    WBC disorder   Non Hodgkin's lymphoma (HCC) 2010    SURGICAL HISTORY: Past Surgical History:  Procedure Laterality Date   CHOLECYSTECTOMY     COLONOSCOPY WITH PROPOFOL N/A 07/06/2020    Procedure: COLONOSCOPY WITH PROPOFOL;  Surgeon: Malissa Hippo, MD;  [facility];  Service: Endoscopy;  Laterality: N/A;  125   GASTRIC BYPASS     HERNIA REPAIR     POLYPECTOMY  07/06/2020   Procedure: POLYPECTOMY;  Surgeon: Malissa Hippo, MD;  [facility];  Service: Endoscopy;;   TESTICLE TORSION REDUCTION      SOCIAL HISTORY: Social History   Socioeconomic History   Marital status: Married    Spouse name: Not on file   Number of children: Not on file   Years of education: Not on file   Highest education level: Not on file  Occupational History   Not on file  Tobacco Use   Smoking status: Every Day    Current packs/day: 0.50    Average packs/day: 0.5 packs/day for 44.0 years (22.0 ttl pk-yrs)    Types: Cigarettes   Smokeless tobacco: Never  Vaping Use   Vaping status: Never Used  Substance and Sexual Activity   Alcohol use: Yes    Alcohol/week: 6.0 standard drinks of alcohol    Types: 6 Cans of beer per week   Drug use: Never   Sexual activity: Not on file  Other Topics Concern   Not on file  Social History Narrative   Not on file   Social Determinants of Health   Financial Resource Strain: Low Risk  (11/20/2018)   Received from [facility], [facility]   Overall Financial Resource Strain (CARDIA)    Difficulty of Paying Living Expenses:  Not hard at all  Food Insecurity: No Food Insecurity (11/20/2018)   Received from [facility], [facility]   Hunger Vital Sign    Worried About Running Out of Food in the Last Year: Never true    Ran Out of Food in the Last Year: Never true  Transportation Needs: No Transportation Needs (11/20/2018)   Received from [facility], [facility]   [facility] - Transportation    Lack of Transportation (Medical): No    Lack of Transportation (Non-Medical): No  Physical Activity: Inactive (11/20/2018)   Received from [facility], [facility]   Exercise Vital Sign    Days of Exercise  per Week: 0 days    Minutes of Exercise per Session: 0 min  Stress: No Stress Concern Present (11/20/2018)   Received from [facility], [facility] - Occupational Stress Questionnaire    Feeling of Stress : Not at all  Social Connections: Moderately Isolated (11/20/2018)   Received from [facility], [facility]   Social Connection and Isolation Panel [NHANES]    Frequency of Communication with Friends and Family: Once a week    Frequency of Social Gatherings with Friends and Family: Never    Attends Religious Services: 1 to 4 times per year    Active Member of [employer] or Organizations: No    Attends Banker Meetings: Never    Marital Status: Married  Catering manager Violence: Not At Risk (11/20/2018)   Received from [facility], [facility]   Humiliation, Afraid, Rape, and Kick questionnaire    Fear of Current or Ex-Partner: No    Emotionally Abused: No    Physically Abused: No    Sexually Abused: No    FAMILY HISTORY: Family History  Problem Relation Age of Onset   Heart attack Mother    Heart attack Father    Alzheimer's disease Maternal Grandmother    Cancer Maternal Grandfather    Cancer Paternal Grandfather     ALLERGIES:  is allergic to terbinafine and terbinafine and related.  MEDICATIONS:  Current Outpatient Medications  Medication Sig Dispense Refill   acetaminophen (TYLENOL) 500 MG tablet Take 500 mg by mouth every 6 (six) hours as needed for moderate pain or headache.      cyanocobalamin (,VITAMIN B-12,) 1000 MCG/ML injection Inject 1,000 mcg into the muscle every 30 (thirty) days.      lisinopril (ZESTRIL) 5 MG tablet Take by mouth. (Patient not taking: Reported on 09/20/2021)     loratadine (CLARITIN) 10 MG tablet Take by mouth.     losartan (COZAAR) 50 MG tablet      Multiple Vitamin (MULTIVITAMINS PO)      OIL OF OREGANO PO Take by mouth. (Patient not taking: Reported on 09/20/2021)      No current facility-administered medications for this visit.    REVIEW OF SYSTEMS:   .10 Point review of Systems was done is negative except as noted above.  PHYSICAL EXAMINATION: ECOG PERFORMANCE STATUS: 1 - Symptomatic but completely ambulatory  . Vitals:   09/30/23 1445  BP: (!) 157/89  Pulse: 92  Resp: 20  Temp: 98.1 F (36.7 C)  SpO2: 99%   Filed Weights   09/30/23 1445  Weight: 160 lb 11.2 oz (72.9 kg)  .Body mass index is 21.2 kg/m.  Marland Kitchen GENERAL:alert, in no acute distress and comfortable SKIN: no acute rashes, no significant lesions EYES:  conjunctiva are pink and non-injected, sclera anicteric OROPHARYNX: MMM, no exudates, no oropharyngeal erythema or ulceration NECK: supple, no JVD LYMPH:  no palpable lymphadenopathy in the cervical, axillary or inguinal regions LUNGS: clear to auscultation b/l with normal respiratory effort HEART: regular rate & rhythm ABDOMEN:  normoactive bowel sounds , non tender, not distended. Extremity: no pedal edema PSYCH: alert & oriented x 3 with fluent speech NEURO: no focal motor/sensory deficits   LABORATORY DATA:  I have reviewed the data as listed     Latest Ref Rng & Units 09/30/2023    2:38 PM 09/19/2022    1:13 PM 09/20/2021    8:20 AM  CBC  WBC 4.0 - 10.5 K/uL 15.8  15.3  12.8   Hemoglobin 13.0 - 17.0 g/dL 13.0  86.5  78.4   Hematocrit 39.0 - 52.0 % 39.4  43.0  43.9   Platelets 150 - 400 K/uL 209  224  227        Latest Ref Rng & Units 09/30/2023    2:38 PM 09/19/2022    1:13 PM 09/20/2021    8:20 AM  CMP  Glucose 70 - 99 mg/dL 93  696  86   BUN 8 - 23 mg/dL 16  19  13    Creatinine 0.61 - 1.24 mg/dL 2.95  2.84  1.32   Sodium 135 - 145 mmol/L 138  137  139   Potassium 3.5 - 5.1 mmol/L 3.9  4.0  4.2   Chloride 98 - 111 mmol/L 104  103  103   CO2 22 - 32 mmol/L 27  26  26    Calcium 8.9 - 10.3 mg/dL 9.9  44.0  9.3   Total Protein 6.5 - 8.1 g/dL 7.3  8.1  8.0   Total Bilirubin <1.2 mg/dL 0.2  0.4  0.5    Alkaline Phos 38 - 126 U/L 93  98  103   AST 15 - 41 U/L 18  17  22    ALT 0 - 44 U/L 15  18  20     . Lab Results  Component Value Date   LDH 157 09/30/2023    RADIOGRAPHIC STUDIES: I have personally reviewed the radiological images as listed and agreed with the findings in the report. No results found.   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 CBC shows elevated WBC of 15.8 K, but stable overall. CMP is stable. Iron saturation is low at 10%. Ferritin level is 30. -Discussed with the patient that ibuprofen can cause ulcers and that if ferritin levels are low, then he will need to follow-up with gastroenterologist regarding colonoscopy.  -No indication of progression of the patient's monoclonal B lymphocytosis to CLL.  No indication for additional work-up or treatment of this condition currently. -Continue follow-up with primary care physician for age-appropriate cancer screening. -Continue Vitamin B-Complex once daily. Continue Vitamin B12 shot once monthly. -Recommend to avoid ibuprofen as it could cause ulcers -Recommend low-dose CT scan for cancer screening with PCP as his next visit is on  10/07/2023.  -Recommend to visit Gastroenterologist.    FOLLOW UP: RTC with Dr Candise Che with labs in 12 months (Call wife Bradley Howard at [phone] for all scheduling)   The total time spent in the appointment was 21 minutes* .  All of the patient's questions were answered with apparent  satisfaction. The patient knows to call the clinic with any problems, questions or concerns.   Wyvonnia Lora MD MS AAHIVMS [facility] Hematology/Oncology Physician [facility]  .*Total Encounter Time as defined by the Centers for Medicare and Medicaid Services includes, in addition to the face-to-face time of a patient visit (documented in the note above) non-face-to-face time: obtaining and reviewing outside history, ordering and reviewing medications, tests or procedures, care coordination (communications with other health care professionals or caregivers) and documentation in the medical record.   I,Param Shah,acting as a Neurosurgeon for Wyvonnia Lora, MD.,have documented all relevant documentation on the behalf of Wyvonnia Lora, MD,as directed by  Wyvonnia Lora, MD while in the presence of Wyvonnia Lora, MD.  .I have reviewed the above documentation for accuracy and completeness, and I agree with the above. Johney Maine MD

## 2023-10-02 ENCOUNTER — Telehealth: Payer: Self-pay | Admitting: Hematology

## 2023-10-02 NOTE — Telephone Encounter (Signed)
Spoke with patient wife confirming upcoming appointment  

## 2023-10-04 ENCOUNTER — Other Ambulatory Visit: Payer: Self-pay | Admitting: Internal Medicine

## 2023-10-04 DIAGNOSIS — G459 Transient cerebral ischemic attack, unspecified: Secondary | ICD-10-CM

## 2023-10-04 DIAGNOSIS — M545 Low back pain, unspecified: Secondary | ICD-10-CM

## 2023-10-08 LAB — MULTIPLE MYELOMA PANEL, SERUM
Albumin SerPl Elph-Mcnc: 3.8 g/dL (ref 2.9–4.4)
Albumin/Glob SerPl: 1.3 (ref 0.7–1.7)
Alpha 1: 0.2 g/dL (ref 0.0–0.4)
Alpha2 Glob SerPl Elph-Mcnc: 0.9 g/dL (ref 0.4–1.0)
B-Globulin SerPl Elph-Mcnc: 0.9 g/dL (ref 0.7–1.3)
Gamma Glob SerPl Elph-Mcnc: 1.1 g/dL (ref 0.4–1.8)
Globulin, Total: 3 g/dL (ref 2.2–3.9)
IgA: 58 mg/dL — ABNORMAL LOW (ref 61–437)
IgG (Immunoglobin G), Serum: 355 mg/dL — ABNORMAL LOW (ref 603–1613)
IgM (Immunoglobulin M), Srm: 1196 mg/dL — ABNORMAL HIGH (ref 20–172)
Total Protein ELP: 6.8 g/dL (ref 6.0–8.5)

## 2023-10-21 NOTE — Discharge Instructions (Signed)

## 2023-10-22 ENCOUNTER — Ambulatory Visit
Admission: RE | Admit: 2023-10-22 | Discharge: 2023-10-22 | Disposition: A | Discharge status: Home / Self Care | Payer: BC Managed Care – PPO | Source: Ambulatory Visit | Attending: Internal Medicine | Admitting: Internal Medicine

## 2023-10-22 DIAGNOSIS — M545 Low back pain, unspecified: Secondary | ICD-10-CM

## 2023-10-22 MED ORDER — IOPAMIDOL (ISOVUE-M 200) INJECTION 41%
1.0000 mL | Freq: Once | INTRAMUSCULAR | Status: AC
  Administered 2023-10-22: 1 mL via EPIDURAL

## 2023-10-22 MED ORDER — METHYLPREDNISOLONE ACETATE 40 MG/ML INJ SUSP (RADIOLOG
80.0000 mg | Freq: Once | INTRAMUSCULAR | Status: AC
  Administered 2023-10-22: 80 mg via EPIDURAL

## 2023-10-26 ENCOUNTER — Ambulatory Visit
Admission: RE | Admit: 2023-10-26 | Discharge: 2023-10-26 | Disposition: A | Discharge status: Home / Self Care | Payer: BC Managed Care – PPO | Source: Ambulatory Visit | Attending: Internal Medicine | Admitting: Internal Medicine

## 2023-10-26 DIAGNOSIS — G459 Transient cerebral ischemic attack, unspecified: Secondary | ICD-10-CM

## 2023-12-05 IMAGING — XA Imaging study
2 series · 2 of 2 positions shown · non-contrast
Comparison: none

CLINICAL DATA: Lumbosacral spondylosis without myelopathy. Chronic
mid and lower back pain radiating to the groin and legs. Excellent
response to multiple prior epidural injections.

[Series 1: ortho standard · 1 of 1 slices shown (1 of 2)]
[im 1/1]
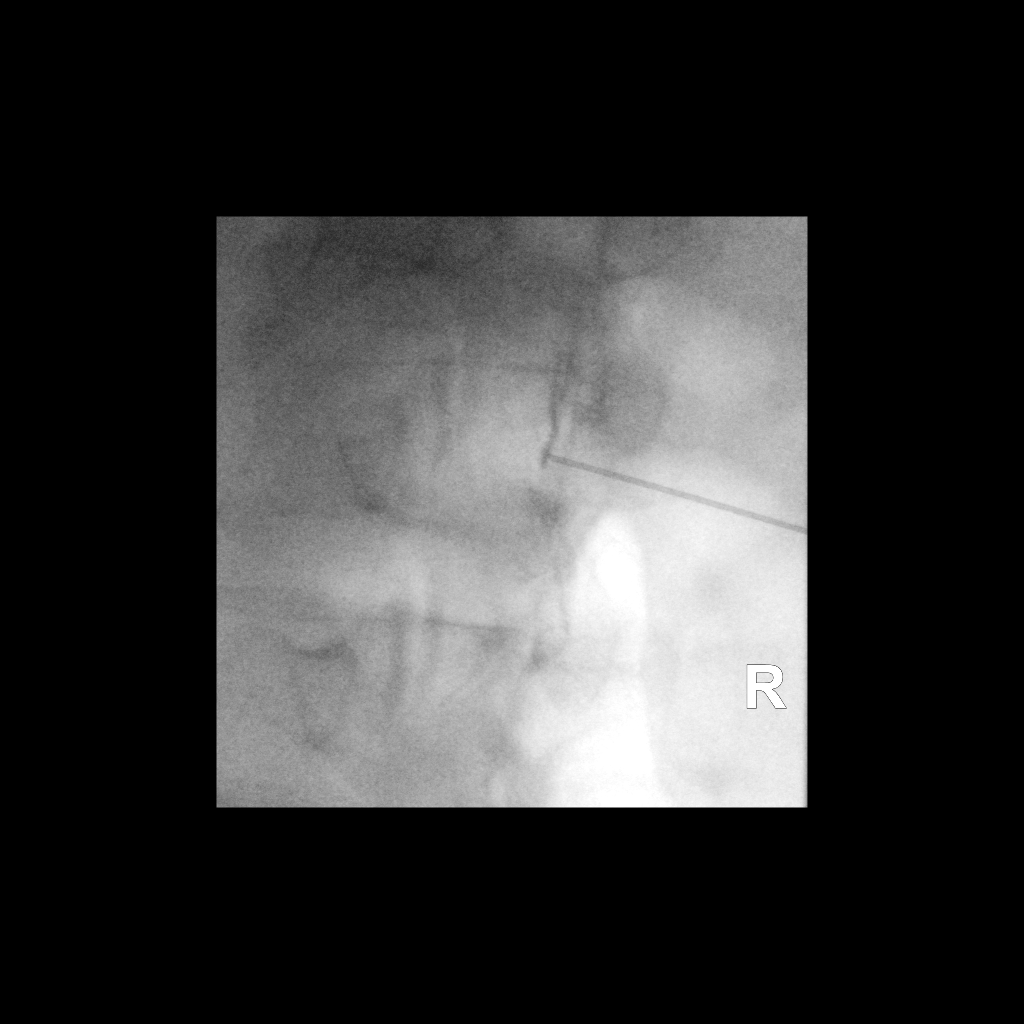

[Series 2: ortho standard · 1 of 1 slices shown (2 of 2)]
[im 1/1]
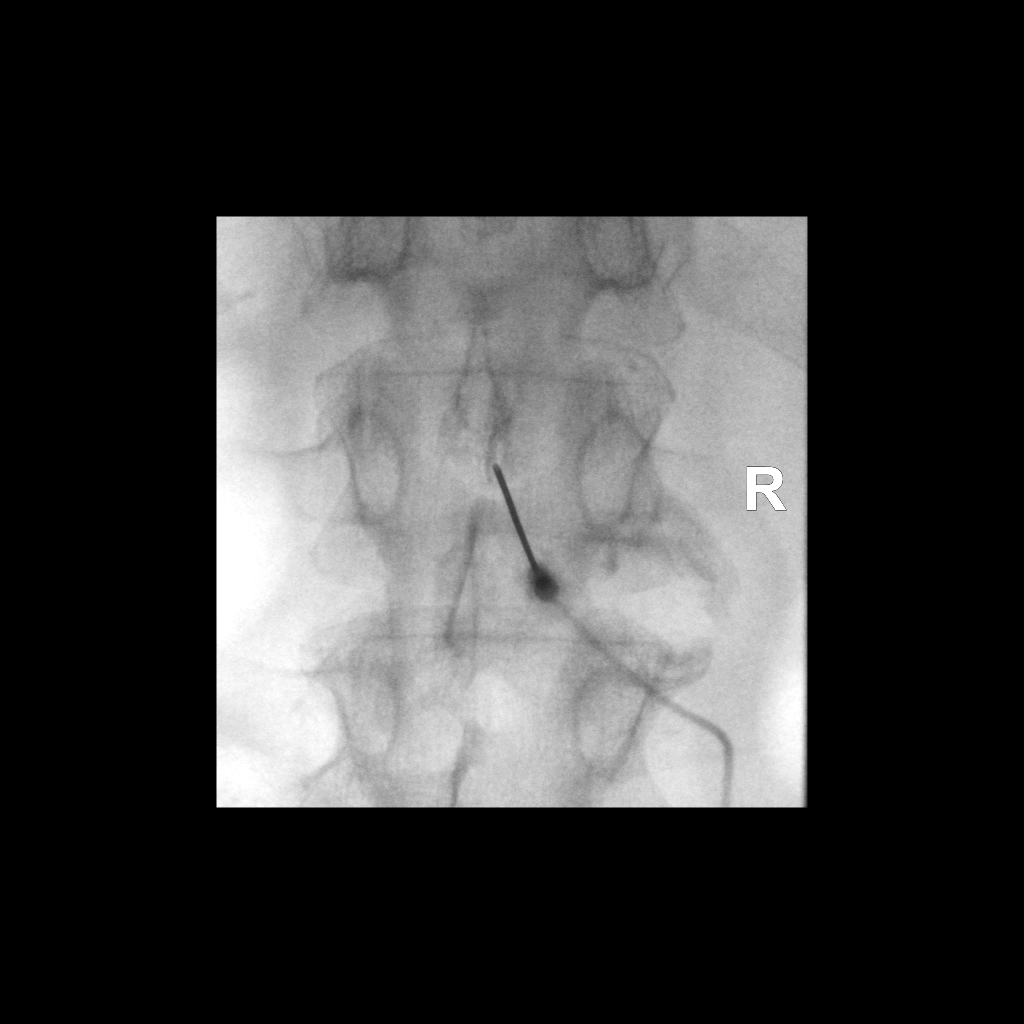

[2 of 2 positions shown; findings below may reference images not displayed]

FLUOROSCOPY:
Radiation Exposure Index (as provided by the fluoroscopic device):
2.10 mGy Kerma

PROCEDURE:
The procedure, risks, benefits, and alternatives were explained to
the patient. Questions regarding the procedure were encouraged and
answered. The patient understands and consents to the procedure.

LUMBAR EPIDURAL INJECTION:

An interlaminar approach was performed on the right at T12-L1. The
overlying skin was cleansed and anesthetized. A 3.5 inch 20 gauge
epidural needle was advanced using loss-of-resistance technique.

DIAGNOSTIC EPIDURAL INJECTION:

Injection of Isovue-M 200 shows a good epidural pattern with spread
above and below the level of needle placement, primarily on the
right. No vascular opacification is seen.

THERAPEUTIC EPIDURAL INJECTION:

80 mg of Depo-Medrol mixed with 3 mL of 1% lidocaine were instilled.
The procedure was well-tolerated, and the patient was discharged
thirty minutes following the injection in good condition.

COMPLICATIONS:
None immediate
IMPRESSION: Technically successful interlaminar epidural injection on the right
at T12-L1.

## 2024-09-30 ENCOUNTER — Other Ambulatory Visit: Payer: BC Managed Care – PPO

## 2024-09-30 ENCOUNTER — Ambulatory Visit: Payer: BC Managed Care – PPO | Admitting: Hematology
